# Patient Record
Sex: Male | Born: 1955 | Race: Black or African American | Hispanic: No | Smoking: Current some day smoker
Health system: Southern US, Community
[De-identification: ages and names within clinical notes are randomized; demographics above are authoritative.]

## PROBLEM LIST (undated history)

## (undated) DIAGNOSIS — H548 Legal blindness, as defined in USA: Secondary | ICD-10-CM

## (undated) DIAGNOSIS — E785 Hyperlipidemia, unspecified: Secondary | ICD-10-CM

## (undated) DIAGNOSIS — C61 Malignant neoplasm of prostate: Secondary | ICD-10-CM

## (undated) DIAGNOSIS — F419 Anxiety disorder, unspecified: Secondary | ICD-10-CM

## (undated) DIAGNOSIS — I2510 Atherosclerotic heart disease of native coronary artery without angina pectoris: Secondary | ICD-10-CM

## (undated) DIAGNOSIS — I214 Non-ST elevation (NSTEMI) myocardial infarction: Secondary | ICD-10-CM

## (undated) DIAGNOSIS — I213 ST elevation (STEMI) myocardial infarction of unspecified site: Secondary | ICD-10-CM

## (undated) DIAGNOSIS — F329 Major depressive disorder, single episode, unspecified: Secondary | ICD-10-CM

## (undated) DIAGNOSIS — F32A Depression, unspecified: Secondary | ICD-10-CM

## (undated) DIAGNOSIS — I10 Essential (primary) hypertension: Secondary | ICD-10-CM

## (undated) DIAGNOSIS — H409 Unspecified glaucoma: Secondary | ICD-10-CM

## (undated) HISTORY — DX: Hyperlipidemia, unspecified: E78.5

## (undated) HISTORY — PX: CARDIOVASCULAR STRESS TEST: SHX262

## (undated) HISTORY — PX: TRANSTHORACIC ECHOCARDIOGRAM: SHX275

## (undated) HISTORY — PX: EYE SURGERY: SHX253

## (undated) HISTORY — PX: CORONARY ANGIOPLASTY WITH STENT PLACEMENT: SHX49

---

## 2010-01-15 ENCOUNTER — Emergency Department (HOSPITAL_COMMUNITY): Admission: EM | Admit: 2010-01-15 | Discharge: 2010-01-15 | Payer: Self-pay | Admitting: Emergency Medicine

## 2010-04-06 ENCOUNTER — Emergency Department (HOSPITAL_COMMUNITY): Admission: EM | Admit: 2010-04-06 | Discharge: 2010-04-06 | Payer: Self-pay | Admitting: Emergency Medicine

## 2010-08-07 ENCOUNTER — Emergency Department (HOSPITAL_COMMUNITY)
Admission: EM | Admit: 2010-08-07 | Discharge: 2010-08-07 | Payer: Self-pay | Source: Home / Self Care | Admitting: Emergency Medicine

## 2010-08-20 ENCOUNTER — Emergency Department (HOSPITAL_COMMUNITY)
Admission: EM | Admit: 2010-08-20 | Discharge: 2010-08-20 | Payer: Self-pay | Source: Home / Self Care | Admitting: Emergency Medicine

## 2010-09-04 ENCOUNTER — Emergency Department (HOSPITAL_COMMUNITY)
Admission: EM | Admit: 2010-09-04 | Discharge: 2010-09-04 | Discharge status: Against Medical Advice | Payer: Medicare Other | Attending: Emergency Medicine | Admitting: Emergency Medicine

## 2010-09-04 DIAGNOSIS — Z0389 Encounter for observation for other suspected diseases and conditions ruled out: Secondary | ICD-10-CM | POA: Insufficient documentation

## 2010-09-14 ENCOUNTER — Emergency Department (HOSPITAL_COMMUNITY)
Admission: EM | Admit: 2010-09-14 | Discharge: 2010-09-14 | Disposition: A | Discharge status: Home / Self Care | Payer: Medicare Other | Attending: Emergency Medicine | Admitting: Emergency Medicine

## 2010-09-14 DIAGNOSIS — H409 Unspecified glaucoma: Secondary | ICD-10-CM | POA: Insufficient documentation

## 2010-09-14 DIAGNOSIS — Z9889 Other specified postprocedural states: Secondary | ICD-10-CM | POA: Insufficient documentation

## 2010-09-14 DIAGNOSIS — H5710 Ocular pain, unspecified eye: Secondary | ICD-10-CM | POA: Insufficient documentation

## 2010-09-14 DIAGNOSIS — Z760 Encounter for issue of repeat prescription: Secondary | ICD-10-CM | POA: Insufficient documentation

## 2010-09-14 DIAGNOSIS — E7800 Pure hypercholesterolemia, unspecified: Secondary | ICD-10-CM | POA: Insufficient documentation

## 2010-09-14 DIAGNOSIS — F411 Generalized anxiety disorder: Secondary | ICD-10-CM | POA: Insufficient documentation

## 2010-12-22 ENCOUNTER — Emergency Department (HOSPITAL_COMMUNITY)
Admission: EM | Admit: 2010-12-22 | Discharge: 2010-12-23 | Disposition: A | Discharge status: Home / Self Care | Payer: Medicare Other | Attending: Emergency Medicine | Admitting: Emergency Medicine

## 2010-12-22 DIAGNOSIS — F411 Generalized anxiety disorder: Secondary | ICD-10-CM | POA: Insufficient documentation

## 2010-12-22 DIAGNOSIS — H409 Unspecified glaucoma: Secondary | ICD-10-CM | POA: Insufficient documentation

## 2010-12-22 DIAGNOSIS — E7800 Pure hypercholesterolemia, unspecified: Secondary | ICD-10-CM | POA: Insufficient documentation

## 2010-12-22 DIAGNOSIS — R1013 Epigastric pain: Secondary | ICD-10-CM | POA: Insufficient documentation

## 2010-12-22 DIAGNOSIS — H543 Unqualified visual loss, both eyes: Secondary | ICD-10-CM | POA: Insufficient documentation

## 2010-12-23 LAB — DIFFERENTIAL
Basophils Absolute: 0.1 10*3/uL (ref 0.0–0.1)
Lymphocytes Relative: 31 % (ref 12–46)
Monocytes Absolute: 0.9 10*3/uL (ref 0.1–1.0)
Neutro Abs: 9 10*3/uL — ABNORMAL HIGH (ref 1.7–7.7)

## 2010-12-23 LAB — CBC
HCT: 43.3 % (ref 39.0–52.0)
Hemoglobin: 15.3 g/dL (ref 13.0–17.0)
WBC: 14.9 10*3/uL — ABNORMAL HIGH (ref 4.0–10.5)

## 2010-12-23 LAB — COMPREHENSIVE METABOLIC PANEL
ALT: 31 U/L (ref 0–53)
Alkaline Phosphatase: 105 U/L (ref 39–117)
CO2: 23 mEq/L (ref 19–32)
GFR calc non Af Amer: >60 mL/min (ref >60)
Glucose, Bld: 106 mg/dL — ABNORMAL HIGH (ref 70–99)
Potassium: 3.9 mEq/L (ref 3.5–5.1)
Sodium: 138 mEq/L (ref 135–145)

## 2010-12-23 LAB — LIPASE, BLOOD: Lipase: 30 U/L (ref 11–59)

## 2017-03-07 ENCOUNTER — Encounter (HOSPITAL_COMMUNITY): Payer: Self-pay

## 2017-03-07 ENCOUNTER — Emergency Department (HOSPITAL_COMMUNITY): Payer: Medicare Other

## 2017-03-07 ENCOUNTER — Emergency Department (HOSPITAL_COMMUNITY)
Admission: EM | Admit: 2017-03-07 | Discharge: 2017-03-07 | Disposition: A | Discharge status: Home / Self Care | Payer: Medicare Other | Attending: Emergency Medicine | Admitting: Emergency Medicine

## 2017-03-07 DIAGNOSIS — Z7982 Long term (current) use of aspirin: Secondary | ICD-10-CM | POA: Diagnosis not present

## 2017-03-07 DIAGNOSIS — Z79899 Other long term (current) drug therapy: Secondary | ICD-10-CM | POA: Insufficient documentation

## 2017-03-07 DIAGNOSIS — I252 Old myocardial infarction: Secondary | ICD-10-CM | POA: Diagnosis not present

## 2017-03-07 DIAGNOSIS — I2510 Atherosclerotic heart disease of native coronary artery without angina pectoris: Secondary | ICD-10-CM | POA: Insufficient documentation

## 2017-03-07 DIAGNOSIS — R0789 Other chest pain: Secondary | ICD-10-CM

## 2017-03-07 DIAGNOSIS — F17210 Nicotine dependence, cigarettes, uncomplicated: Secondary | ICD-10-CM | POA: Diagnosis not present

## 2017-03-07 HISTORY — DX: Atherosclerotic heart disease of native coronary artery without angina pectoris: I25.10

## 2017-03-07 LAB — COMPREHENSIVE METABOLIC PANEL
ALBUMIN: 3.8 g/dL (ref 3.5–5.0)
ALT: 29 U/L (ref 17–63)
AST: 21 U/L (ref 15–41)
Alkaline Phosphatase: 105 U/L (ref 38–126)
Anion gap: 9 (ref 5–15)
BUN: 14 mg/dL (ref 6–20)
CHLORIDE: 107 mmol/L (ref 101–111)
CO2: 22 mmol/L (ref 22–32)
CREATININE: 0.92 mg/dL (ref 0.61–1.24)
Calcium: 9.1 mg/dL (ref 8.9–10.3)
GFR calc Af Amer: >60 mL/min (ref >60)
GLUCOSE: 99 mg/dL (ref 65–99)
Potassium: 3.8 mmol/L (ref 3.5–5.1)
Sodium: 138 mmol/L (ref 135–145)
Total Bilirubin: 0.2 mg/dL — ABNORMAL LOW (ref 0.3–1.2)
Total Protein: 7.4 g/dL (ref 6.5–8.1)

## 2017-03-07 LAB — CBC WITH DIFFERENTIAL/PLATELET
Basophils Absolute: 0.1 10*3/uL (ref 0.0–0.1)
Basophils Relative: 0 %
Eosinophils Absolute: 0.3 10*3/uL (ref 0.0–0.7)
Eosinophils Relative: 3 %
HEMATOCRIT: 42.6 % (ref 39.0–52.0)
HEMOGLOBIN: 14.8 g/dL (ref 13.0–17.0)
LYMPHS ABS: 3.8 10*3/uL (ref 0.7–4.0)
LYMPHS PCT: 31 %
MCH: 30.4 pg (ref 26.0–34.0)
MCHC: 34.7 g/dL (ref 30.0–36.0)
MCV: 87.5 fL (ref 78.0–100.0)
Monocytes Absolute: 0.6 10*3/uL (ref 0.1–1.0)
Monocytes Relative: 5 %
NEUTROS ABS: 7.3 10*3/uL (ref 1.7–7.7)
NEUTROS PCT: 61 %
PLATELETS: 226 10*3/uL (ref 150–400)
RBC: 4.87 MIL/uL (ref 4.22–5.81)
RDW: 13.5 % (ref 11.5–15.5)
WBC: 12 10*3/uL — ABNORMAL HIGH (ref 4.0–10.5)

## 2017-03-07 LAB — PROTIME-INR
INR: 1.02
PROTHROMBIN TIME: 13.4 s (ref 11.4–15.2)

## 2017-03-07 LAB — BRAIN NATRIURETIC PEPTIDE: B Natriuretic Peptide: 10.8 pg/mL (ref 0.0–100.0)

## 2017-03-07 LAB — TROPONIN I: Troponin I: <0.03 ng/mL (ref <0.03)

## 2017-03-07 LAB — MAGNESIUM: MAGNESIUM: 2 mg/dL (ref 1.7–2.4)

## 2017-03-07 MED ORDER — ASPIRIN 81 MG PO CHEW
324.0000 mg | CHEWABLE_TABLET | Freq: Once | ORAL | Status: AC
  Administered 2017-03-07: 324 mg via ORAL
  Filled 2017-03-07: qty 4

## 2017-03-07 NOTE — ED Provider Notes (Signed)
[facility] DEPT Provider Note   CSN: [id number] Arrival date & time: 03/07/17  1528     History   Chief Complaint Chief Complaint  Patient presents with  . Chest Pain    HPI Stephen Stone is a 61 y.o. male.  HPI  Patient presents for an episode of chest pain which has resolved. Patient has history of CAD, with multiple prior MI. He notes today, a few hours ago he felt pressure in his upper chest. Pain was mild. Patient had complete relief after belching a few times. Given his history he was brought here by his daughter for evaluation. He notes that he has been generally otherwise well, taking medication as directed. He recently moved to this area, and is scheduled to see primary care next week.   Past Medical History:  Diagnosis Date  . Blind   . Coronary artery disease   . History of coronary artery stent placement     There are no active problems to display for this patient.   History reviewed. No pertinent surgical history.     Home Medications    Prior to Admission medications   Medication Sig Start Date End Date Taking? Authorizing Provider  aspirin 325 MG EC tablet Take 325 mg by mouth daily.   Yes [provider]  atorvastatin (LIPITOR) 80 MG tablet Take 80 mg by mouth daily.   Yes [provider]  escitalopram (LEXAPRO) 20 MG tablet Take 20 mg by mouth daily.   Yes [provider]  lisinopril (PRINIVIL,ZESTRIL) 20 MG tablet Take 20 mg by mouth daily.   Yes [provider]  metoprolol succinate (TOPROL-XL) 100 MG 24 hr tablet Take 100 mg by mouth daily. Take with or immediately following a meal.   Yes [provider]  Misc Natural Products (PROSTATE HEALTH PO) Take 1 tablet by mouth daily.   Yes [provider]  QUEtiapine (SEROQUEL) 200 MG tablet Take 200 mg by mouth at bedtime.   Yes [provider]    Family History History reviewed. No pertinent family history.  Social  History Social History  Substance Use Topics  . Smoking status: Current Every Day Smoker  . Smokeless tobacco: Never Used  . Alcohol use No     Allergies   Patient has no known allergies.   Review of Systems Review of Systems  Constitutional:       Per HPI, otherwise negative  HENT:       Per HPI, otherwise negative  Eyes:       Blind  Respiratory:       Per HPI, otherwise negative  Cardiovascular:       Per HPI, otherwise negative  Gastrointestinal: Negative for vomiting.  Endocrine:       Negative aside from HPI  Genitourinary:       Neg aside from HPI   Musculoskeletal:       Per HPI, otherwise negative  Skin: Negative.   Neurological: Negative for syncope.     Physical Exam Updated Vital Signs There were no vitals taken for this visit.  Physical Exam  Constitutional: He is oriented to person, place, and time. He appears well-developed. No distress.  HENT:  Head: Normocephalic and atraumatic.  Eyes:  Blind  Cardiovascular: Normal rate and regular rhythm.   Pulmonary/Chest: Effort normal. No stridor. No respiratory distress.  Abdominal: He exhibits no distension.  Musculoskeletal: He exhibits no edema.  Neurological: He is alert and oriented to person, place, and time.  Skin: Skin is warm and dry.  Psychiatric: He has a normal mood and affect.  Nursing note and vitals reviewed.    ED Treatments / Results  Labs (all labs ordered are listed, but only abnormal results are displayed) Labs Reviewed  COMPREHENSIVE METABOLIC PANEL  MAGNESIUM  TROPONIN I  BRAIN NATRIURETIC PEPTIDE  CBC WITH DIFFERENTIAL/PLATELET  PROTIME-INR  CBG MONITORING, ED    EKG  EKG Interpretation  Date/Time:  Friday March 07 2017 15:34:40 EDT Ventricular Rate:  90 PR Interval:    QRS Duration: 89 QT Interval:  351 QTC Calculation: 430 R Axis:   34 Text Interpretation:  Sinus rhythm Minimal ST elevation, inferior leads Borderline ECG Confirmed by Carmin Muskrat  [phone]) on 03/07/2017 4:23:21 PM Also confirmed by Carmin Muskrat ([id number]), editor Laurena Spies [phone])  on 03/07/2017 4:29:33 PM       Radiology Dg Chest Portable 1 View  Result Date: 03/07/2017 CLINICAL DATA:  Chest pain EXAM: PORTABLE CHEST 1 VIEW COMPARISON:  None. FINDINGS: There is slight scarring in the left base. The lungs elsewhere are clear. The heart size and pulmonary vascularity are normal. No adenopathy. No pneumothorax. No bone lesions. IMPRESSION: Mobile scarring left base. No edema or consolidation. Cardiac silhouette within normal limits. Electronically Signed   By: Lowella Grip III M.D.   On: 03/07/2017 16:59    Procedures Procedures (including critical care time)  Medications Ordered in ED Medications  aspirin chewable tablet 324 mg (not administered)     Initial Impression / Assessment and Plan / ED Course  I have reviewed the triage vital signs and the nursing notes.  Pertinent labs & imaging results that were available during my care of the patient were reviewed by me and considered in my medical decision making (see chart for details).  On repeat exam the patient is in no distress, awake, alert, still no ongoing pain. With nonischemic EKG, unremarkable x-ray, reassuring labs, low suspicion for atypical ACS. No evidence for other acute new pathology, including pneumonia, pneumothorax, PE. Patient is appropriately medically managed for his CAD. Patient will follow up as scheduled with primary care next week.   Final Clinical Impressions(s) / ED Diagnoses  Atypical chest pain   Carmin Muskrat, MD 03/07/17 1801

## 2017-03-07 NOTE — ED Notes (Signed)
Bed: WTR5 Expected date:  Expected time:  Means of arrival:  Comments: 

## 2017-03-07 NOTE — ED Triage Notes (Signed)
Per pt, chest pain starting x 2 hours ago. Hx of MI with stents.  No n/v.  No shortness of breath

## 2017-03-07 NOTE — Discharge Instructions (Signed)
As discussed, your evaluation today has been largely reassuring.  But, it is important that you monitor your condition carefully, and do not hesitate to return to the ED if you develop new, or concerning changes in your condition. ? ?Otherwise, please follow-up with your physician for appropriate ongoing care. ? ?

## 2017-03-13 ENCOUNTER — Ambulatory Visit (INDEPENDENT_AMBULATORY_CARE_PROVIDER_SITE_OTHER): Payer: Medicare Other | Admitting: Physician Assistant

## 2017-03-13 ENCOUNTER — Encounter (INDEPENDENT_AMBULATORY_CARE_PROVIDER_SITE_OTHER): Payer: Self-pay | Admitting: Physician Assistant

## 2017-03-13 VITALS — BP 158/96 | HR 88 | Temp 98.0°F | Wt 225.6 lb

## 2017-03-13 DIAGNOSIS — H409 Unspecified glaucoma: Secondary | ICD-10-CM | POA: Diagnosis not present

## 2017-03-13 DIAGNOSIS — I25709 Atherosclerosis of coronary artery bypass graft(s), unspecified, with unspecified angina pectoris: Secondary | ICD-10-CM | POA: Diagnosis not present

## 2017-03-13 DIAGNOSIS — C61 Malignant neoplasm of prostate: Secondary | ICD-10-CM | POA: Diagnosis not present

## 2017-03-13 DIAGNOSIS — I209 Angina pectoris, unspecified: Secondary | ICD-10-CM | POA: Diagnosis not present

## 2017-03-13 DIAGNOSIS — Z760 Encounter for issue of repeat prescription: Secondary | ICD-10-CM | POA: Diagnosis not present

## 2017-03-13 MED ORDER — ESCITALOPRAM OXALATE 20 MG PO TABS: 20.0000 mg | ORAL_TABLET | Freq: Every day | ORAL | 3 refills | Status: DC

## 2017-03-13 MED ORDER — QUETIAPINE FUMARATE 200 MG PO TABS: 200.0000 mg | ORAL_TABLET | Freq: Every day | ORAL | 3 refills | Status: DC

## 2017-03-13 MED ORDER — ATORVASTATIN CALCIUM 80 MG PO TABS: 80.0000 mg | ORAL_TABLET | Freq: Every evening | ORAL | 3 refills | Status: DC

## 2017-03-13 MED ORDER — LISINOPRIL 20 MG PO TABS: 20.0000 mg | ORAL_TABLET | Freq: Every day | ORAL | 3 refills | Status: DC

## 2017-03-13 MED ORDER — ASPIRIN 325 MG PO TBEC: 325.0000 mg | DELAYED_RELEASE_TABLET | Freq: Every day | ORAL | 30 refills | Status: DC

## 2017-03-13 MED ORDER — FOLIC ACID 400 MCG PO TABS: 400.0000 ug | ORAL_TABLET | Freq: Every day | ORAL | 3 refills | Status: DC

## 2017-03-13 MED ORDER — METOPROLOL SUCCINATE ER 100 MG PO TB24: 100.0000 mg | ORAL_TABLET | Freq: Every day | ORAL | 3 refills | Status: DC

## 2017-03-13 NOTE — Progress Notes (Signed)
Subjective:  Patient ID: Stephen Stone, male    DOB: 1955/10/09  Age: 61 y.o. MRN: [id number]  CC: new patient needing referrals   HPI Stephen Stone is a 61 y.o. male with a PMH of blindness, CAD, PCI s/p stent 09/2102, and stage 6 prostate cancer. Is coming from [place] since 2 months ago. Was "spoiled" in [place] and is looking for experienced doctors to maintain his health. Has two brothers that have died from prostate cancer.     Asks for an ophthalmologist that specializes in ophthalmological diseases. 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     Patient request referral to cardiology so he may f/u with his CAD s/p cobalt stent. Says he only wants a doctor that "knows what he is talking about".    Requests urology referral for implantation of seeds for his prostate cancer. Says he is prepared to give all medical records to each respective specialist. All he wants is the referral to the specialists. Had no pills today and requests refill of all his medications. Does not endorse any complaint or symptom.   Outpatient Medications Prior to Visit  Medication Sig Dispense Refill  . aspirin 325 MG EC tablet Take 325 mg by mouth daily.    Marland Kitchen atorvastatin (LIPITOR) 80 MG tablet Take 80 mg by mouth every evening.     Marland Kitchen lisinopril (PRINIVIL,ZESTRIL) 20 MG tablet Take 20 mg by mouth daily.    . metoprolol succinate (TOPROL-XL) 100 MG 24 hr tablet Take 100 mg by mouth daily. Take with or immediately following a meal.    . QUEtiapine (SEROQUEL) 200 MG tablet Take 200 mg by mouth at bedtime.    Marland Kitchen escitalopram (LEXAPRO) 20 MG tablet Take 20 mg by mouth daily.    . Misc Natural Products (PROSTATE HEALTH PO) Take 1 tablet by mouth daily.     No facility-administered medications prior to visit.      ROS Review of Systems  Constitutional: Negative for chills, fever and malaise/fatigue.  Eyes: Negative for blurred vision.   Respiratory: Negative for shortness of breath.   Cardiovascular: Negative for chest pain and palpitations.  Gastrointestinal: Negative for abdominal pain and nausea.  Genitourinary: Negative for dysuria and hematuria.  Musculoskeletal: Negative for joint pain and myalgias.  Skin: Negative for rash.  Neurological: Negative for tingling and headaches.  Psychiatric/Behavioral: Negative for depression. The patient is not nervous/anxious.     Objective:  BP (!) 158/96 (BP Location: Left Arm, Patient Position: Sitting, Cuff Size: Normal)   Pulse 88   Temp 98 F (36.7 C) (Oral)   Wt 225 lb 9.6 oz (102.3 kg)   SpO2 97%   BMI 30.60 kg/m   BP/Weight 03/13/2017 11/14/6220  Systolic BP 979 892  Diastolic BP 96 80  Wt. (Lbs) 225.6 220  BMI 30.6 29.84      Physical Exam  Constitutional: He is oriented to person, place, and time.  Well developed, well nourished, NAD, polite, uses walking stick.  HENT:  Head: Normocephalic and atraumatic.  Eyes: No scleral icterus.  Opacified right eye.  Neck: Normal range of motion. Neck supple. No thyromegaly present.  Cardiovascular: Normal rate, regular rhythm and normal heart sounds.   No carotid bruit  Pulmonary/Chest: Effort normal and breath sounds normal.  Musculoskeletal: He exhibits no edema.  Neurological: He is alert and oriented to person, place, and time. No cranial nerve deficit. Coordination normal.  Skin: Skin is warm and dry. No rash noted. No erythema. No pallor.  Psychiatric: He has a normal mood and affect. His behavior is normal. Thought content normal.  Vitals reviewed.    Assessment & Plan:    1. Glaucoma of both eyes, unspecified glaucoma type - Ambulatory referral to Ophthalmology  2. Coronary artery disease involving coronary bypass graft with angina pectoris, unspecified whether native or transplanted heart [facility]) - Ambulatory referral to Cardiology  3. Prostate cancer [facility]) - Ambulatory referral to Urology  4.  Medication refill - QUEtiapine (SEROQUEL) 200 MG tablet; Take 1 tablet (200 mg total) by mouth at bedtime.  Dispense: 90 tablet; Refill: 3 - metoprolol succinate (TOPROL-XL) 100 MG 24 hr tablet; Take 1 tablet (100 mg total) by mouth daily. Take with or immediately following a meal.  Dispense: 90 tablet; Refill: 3 - lisinopril (PRINIVIL,ZESTRIL) 20 MG tablet; Take 1 tablet (20 mg total) by mouth daily.  Dispense: 90 tablet; Refill: 3 - folic acid (FOLVITE) 628 MCG tablet; Take 1 tablet (400 mcg total) by mouth daily.  Dispense: 90 tablet; Refill: 3 - escitalopram (LEXAPRO) 20 MG tablet; Take 1 tablet (20 mg total) by mouth daily.  Dispense: 90 tablet; Refill: 3 - atorvastatin (LIPITOR) 80 MG tablet; Take 1 tablet (80 mg total) by mouth every evening.  Dispense: 90 tablet; Refill: 3 - aspirin 325 MG EC tablet; Take 1 tablet (325 mg total) by mouth daily.  Dispense: 90 tablet; Refill: 30   Meds ordered this encounter  Medications  . QUEtiapine (SEROQUEL) 200 MG tablet    Sig: Take 1 tablet (200 mg total) by mouth at bedtime.    Dispense:  90 tablet    Refill:  3    Order Specific Question:   Supervising Provider    Answer:   Tresa Garter [id number]  . metoprolol succinate (TOPROL-XL) 100 MG 24 hr tablet    Sig: Take 1 tablet (100 mg total) by mouth daily. Take with or immediately following a meal.    Dispense:  90 tablet    Refill:  3    Order Specific Question:   Supervising Provider    Answer:   Tresa Garter [id number]  . lisinopril (PRINIVIL,ZESTRIL) 20 MG tablet    Sig: Take 1 tablet (20 mg total) by mouth daily.    Dispense:  90 tablet    Refill:  3    Order Specific Question:   Supervising Provider    Answer:   Tresa Garter [id number]  . folic acid (FOLVITE) 366 MCG tablet    Sig: Take 1 tablet (400 mcg total) by mouth daily.    Dispense:  90 tablet    Refill:  3    Order Specific Question:   Supervising Provider    Answer:   Tresa Garter [id number]   . escitalopram (LEXAPRO) 20 MG tablet    Sig: Take 1 tablet (20 mg total) by mouth daily.    Dispense:  90 tablet    Refill:  3    Order Specific Question:   Supervising Provider    Answer:   Tresa Garter [id number]  . atorvastatin (LIPITOR) 80 MG tablet    Sig: Take 1 tablet (80 mg total) by mouth every evening.    Dispense:  90 tablet    Refill:  3    Order Specific Question:   Supervising Provider    Answer:   Tresa Garter [id number]  . aspirin 325 MG EC tablet  Sig: Take 1 tablet (325 mg total) by mouth daily.    Dispense:  90 tablet    Refill:  30    Order Specific Question:   Supervising Provider    Answer:   Tresa Garter [[id number]]    Follow-up: Return in about 6 weeks (around 04/24/2017).   [name] PA

## 2017-03-13 NOTE — Progress Notes (Signed)
Pt has stage 6 low grade prostate cancer Pt has had 4 heart attacks  Pt has cobalt stent placed

## 2017-03-19 ENCOUNTER — Other Ambulatory Visit (INDEPENDENT_AMBULATORY_CARE_PROVIDER_SITE_OTHER): Payer: Self-pay | Admitting: Physician Assistant

## 2017-03-19 ENCOUNTER — Other Ambulatory Visit (INDEPENDENT_AMBULATORY_CARE_PROVIDER_SITE_OTHER): Payer: Medicare Other

## 2017-03-19 DIAGNOSIS — I25810 Atherosclerosis of coronary artery bypass graft(s) without angina pectoris: Secondary | ICD-10-CM

## 2017-03-19 DIAGNOSIS — C61 Malignant neoplasm of prostate: Secondary | ICD-10-CM

## 2017-03-19 NOTE — Progress Notes (Signed)
Pt asking for labs to be performed based on last visit's complaints.

## 2017-03-20 LAB — CBC WITH DIFFERENTIAL/PLATELET
Basophils Absolute: 0 10*3/uL (ref 0.0–0.2)
Basos: 0 %
EOS (ABSOLUTE): 0.3 10*3/uL (ref 0.0–0.4)
EOS: 2 %
HEMATOCRIT: 41 % (ref 37.5–51.0)
HEMOGLOBIN: 13.6 g/dL (ref 13.0–17.7)
Immature Grans (Abs): 0 10*3/uL (ref 0.0–0.1)
Immature Granulocytes: 0 %
LYMPHS ABS: 2.7 10*3/uL (ref 0.7–3.1)
Lymphs: 19 %
MCH: 30 pg (ref 26.6–33.0)
MCHC: 33.2 g/dL (ref 31.5–35.7)
MCV: 91 fL (ref 79–97)
MONOCYTES: 4 %
Monocytes Absolute: 0.6 10*3/uL (ref 0.1–0.9)
NEUTROS ABS: 10.1 10*3/uL — AB (ref 1.4–7.0)
Neutrophils: 75 %
Platelets: 223 10*3/uL (ref 150–379)
RBC: 4.53 x10E6/uL (ref 4.14–5.80)
RDW: 14.4 % (ref 12.3–15.4)
WBC: 13.7 10*3/uL — ABNORMAL HIGH (ref 3.4–10.8)

## 2017-03-20 LAB — COMPREHENSIVE METABOLIC PANEL
ALBUMIN: 3.8 g/dL (ref 3.6–4.8)
ALT: 24 IU/L (ref 0–44)
AST: 16 IU/L (ref 0–40)
Albumin/Globulin Ratio: 1.4 (ref 1.2–2.2)
Alkaline Phosphatase: 117 IU/L (ref 39–117)
BILIRUBIN TOTAL: 0.2 mg/dL (ref 0.0–1.2)
BUN / CREAT RATIO: 13 (ref 10–24)
BUN: 14 mg/dL (ref 8–27)
CO2: 20 mmol/L (ref 20–29)
CREATININE: 1.08 mg/dL (ref 0.76–1.27)
Calcium: 8.7 mg/dL (ref 8.6–10.2)
Chloride: 107 mmol/L — ABNORMAL HIGH (ref 96–106)
GFR calc non Af Amer: 73 mL/min/{1.73_m2} (ref >59)
GFR, EST AFRICAN AMERICAN: 84 mL/min/{1.73_m2} (ref >59)
GLUCOSE: 102 mg/dL — AB (ref 65–99)
Globulin, Total: 2.8 g/dL (ref 1.5–4.5)
Potassium: 4.2 mmol/L (ref 3.5–5.2)
Sodium: 142 mmol/L (ref 134–144)
TOTAL PROTEIN: 6.6 g/dL (ref 6.0–8.5)

## 2017-03-20 LAB — LIPID PANEL
CHOL/HDL RATIO: 3.9 ratio (ref 0.0–5.0)
Cholesterol, Total: 125 mg/dL (ref 100–199)
HDL: 32 mg/dL — AB (ref >39)
LDL CALC: 69 mg/dL (ref 0–99)
TRIGLYCERIDES: 122 mg/dL (ref 0–149)
VLDL Cholesterol Cal: 24 mg/dL (ref 5–40)

## 2017-03-20 LAB — PSA: Prostate Specific Ag, Serum: 18.4 ng/mL — ABNORMAL HIGH (ref 0.0–4.0)

## 2017-03-25 ENCOUNTER — Encounter (INDEPENDENT_AMBULATORY_CARE_PROVIDER_SITE_OTHER): Payer: Self-pay | Admitting: General Practice

## 2017-04-08 NOTE — Progress Notes (Signed)
Referring-Roger Roderic Ovens PA-C Reason for referral-CAD  HPI: 61 yo male for evaluation of CAD at request of [name], PA-C. Patient seen in the emergency room August 2018 with chest pain. Chest x-ray showed scarring left base but no edema or consolidation. Troponins normal. BNP 10.8. Liver functions normal. Hemoglobin 14.8. Patient previously resided in [place]. He apparently had a myocardial infarction 4-5 years ago in [place] and had a stent placed. No records available. He had follow-up stress tests and echocardiogram in [place]. He moved to this area in April to be close to his daughter. He was seen in emergency room with substernal chest pressure that he feels was likely indigestion. No radiation or associated symptoms and resolved with GI cocktail. He otherwise denies dyspnea, exertional chest pain, palpitations or syncope. Because of the above we were asked to evaluate.   Current Outpatient Prescriptions  Medication Sig Dispense Refill  . aspirin 325 MG EC tablet Take 1 tablet (325 mg total) by mouth daily. 90 tablet 30  . atorvastatin (LIPITOR) 80 MG tablet Take 1 tablet (80 mg total) by mouth every evening. 90 tablet 3  . escitalopram (LEXAPRO) 20 MG tablet Take 1 tablet (20 mg total) by mouth daily. 90 tablet 3  . folic acid (FOLVITE) 161 MCG tablet Take 1 tablet (400 mcg total) by mouth daily. 90 tablet 3  . lisinopril (PRINIVIL,ZESTRIL) 20 MG tablet Take 1 tablet (20 mg total) by mouth daily. 90 tablet 3  . metoprolol succinate (TOPROL-XL) 100 MG 24 hr tablet Take 1 tablet (100 mg total) by mouth daily. Take with or immediately following a meal. 90 tablet 3  . Misc Natural Products (PROSTATE HEALTH PO) Take 1 tablet by mouth daily.    . QUEtiapine (SEROQUEL) 200 MG tablet Take 1 tablet (200 mg total) by mouth at bedtime. 90 tablet 3   No current facility-administered medications for this visit.     No Known Allergies   Past Medical History:  Diagnosis Date    . Blind   . Coronary artery disease   . History of coronary artery stent placement   . Hyperlipidemia     Past Surgical History:  Procedure Laterality Date  . EYE SURGERY      Social History   Social History  . Marital status: Divorced    Spouse name: N/A  . Number of children: 7  . Years of education: N/A   Occupational History  . Not on file.   Social History Main Topics  . Smoking status: Former Research scientist (life sciences)  . Smokeless tobacco: Never Used  . Alcohol use No  . Drug use: Yes    Types: Marijuana  . Sexual activity: Not on file   Other Topics Concern  . Not on file   Social History Narrative  . No narrative on file    Family History  Problem Relation Age of Onset  . Cancer Mother   . CAD Father     ROS: no fevers or chills, productive cough, hemoptysis, dysphasia, odynophagia, melena, hematochezia, dysuria, hematuria, rash, seizure activity, orthopnea, PND, pedal edema, claudication. Remaining systems are negative.  Physical Exam:   Blood pressure 110/78, pulse 72, height 6' (1.829 m), weight 112.5 kg (248 lb).  General:  Well developed/well nourished in NAD Skin warm/dry Patient not depressed No peripheral clubbing Back-normal HEENT-patient is blind with abnormality in right eye  Neck supple/normal carotid upstroke bilaterally; no bruits; no JVD; no thyromegaly chest - CTA/ normal expansion CV -  RRR/normal S1 and S2; no murmurs, rubs or gallops;  PMI nondisplaced Abdomen -NT/ND, no HSM, no mass, + bowel sounds, no bruit 2+ femoral pulses, no bruits Ext-no edema, chords, 2+ DP Neuro-grossly nonfocal  ECG - 03/07/2017-sinus rhythm with no ST changes. personally reviewed  A/P  1 Coronary artery disease-we will obtain all previous records from [place]. Continue aspirin and statin.    2 Chest pain-recent evaluation in the emergency room. Troponins normal. Likely GI in etiology. Arrange stress nuclear study for risk stratification.   3  Hyperlipidemia-continue statin.  4 hypertension-blood pressure is controlled. Continue present medications.  5 tobacco abuse-patient counseled on discontinuing.  [name], MD

## 2017-04-14 ENCOUNTER — Ambulatory Visit (INDEPENDENT_AMBULATORY_CARE_PROVIDER_SITE_OTHER): Payer: Medicare Other | Admitting: Cardiology

## 2017-04-14 ENCOUNTER — Encounter (INDEPENDENT_AMBULATORY_CARE_PROVIDER_SITE_OTHER): Payer: Self-pay

## 2017-04-14 ENCOUNTER — Encounter: Payer: Self-pay | Admitting: Cardiology

## 2017-04-14 VITALS — BP 110/78 | HR 72 | Ht 72.0 in | Wt 248.0 lb

## 2017-04-14 DIAGNOSIS — I10 Essential (primary) hypertension: Secondary | ICD-10-CM | POA: Diagnosis not present

## 2017-04-14 DIAGNOSIS — I2510 Atherosclerotic heart disease of native coronary artery without angina pectoris: Secondary | ICD-10-CM

## 2017-04-14 DIAGNOSIS — E7800 Pure hypercholesterolemia, unspecified: Secondary | ICD-10-CM | POA: Diagnosis not present

## 2017-04-14 NOTE — Patient Instructions (Signed)
Medication Instructions:   NO CHANGE  Testing/Procedures:  Your physician has requested that you have en exercise stress myoview. For further information please visit [email or web address]. Please follow instruction sheet, as given.TAKE ALL MEDICATIONS    Follow-Up:  Your physician wants you to follow-up in: [place] will receive a reminder letter in the mail two months in advance. If you don't receive a letter, please call our office to schedule the follow-up appointment.   If you need a refill on your cardiac medications before your next appointment, please call your pharmacy.

## 2017-04-18 ENCOUNTER — Telehealth (HOSPITAL_COMMUNITY): Payer: Self-pay

## 2017-04-18 NOTE — Telephone Encounter (Signed)
Encounter complete. 

## 2017-04-23 ENCOUNTER — Ambulatory Visit (HOSPITAL_COMMUNITY)
Admission: RE | Admit: 2017-04-23 | Discharge: 2017-04-23 | Disposition: A | Discharge status: Home / Self Care | Payer: Medicare Other | Source: Ambulatory Visit | Attending: Cardiology | Admitting: Cardiology

## 2017-04-23 ENCOUNTER — Ambulatory Visit (INDEPENDENT_AMBULATORY_CARE_PROVIDER_SITE_OTHER): Payer: Medicare Other | Admitting: Physician Assistant

## 2017-04-23 DIAGNOSIS — I2510 Atherosclerotic heart disease of native coronary artery without angina pectoris: Secondary | ICD-10-CM | POA: Insufficient documentation

## 2017-04-23 LAB — MYOCARDIAL PERFUSION IMAGING
CHL CUP NUCLEAR SDS: 2
CHL CUP NUCLEAR SSS: 6
CHL CUP RESTING HR STRESS: 73 {beats}/min
LV dias vol: 86 mL (ref 62–150)
LV sys vol: 36 mL
NUC STRESS TID: 1.26
Peak HR: 107 {beats}/min
SRS: 4

## 2017-04-23 MED ORDER — TECHNETIUM TC 99M TETROFOSMIN IV KIT
30.2000 | PACK | Freq: Once | INTRAVENOUS | Status: AC | PRN
  Administered 2017-04-23: 30.2 via INTRAVENOUS
  Filled 2017-04-23: qty 31

## 2017-04-23 MED ORDER — REGADENOSON 0.4 MG/5ML IV SOLN
0.4000 mg | Freq: Once | INTRAVENOUS | Status: AC
  Administered 2017-04-23: 0.4 mg via INTRAVENOUS

## 2017-04-23 MED ORDER — TECHNETIUM TC 99M TETROFOSMIN IV KIT
10.8000 | PACK | Freq: Once | INTRAVENOUS | Status: AC | PRN
  Administered 2017-04-23: 10.8 via INTRAVENOUS
  Filled 2017-04-23: qty 11

## 2017-05-20 ENCOUNTER — Telehealth: Payer: Self-pay | Admitting: Cardiology

## 2017-05-20 NOTE — Telephone Encounter (Signed)
Faxed Release signed by patient to Dr Joycelyn Rua to obtain records per Dr Stanford Breed.  Faxed on 05/20/17. lp

## 2017-06-05 ENCOUNTER — Telehealth: Payer: Self-pay | Admitting: Cardiology

## 2017-06-05 NOTE — Telephone Encounter (Signed)
Received incoming records from Dr. Rollene Fare Chiu's office. Records given to [place], [place]. 06/05/17 ab

## 2017-06-18 ENCOUNTER — Other Ambulatory Visit: Payer: Self-pay

## 2017-06-18 ENCOUNTER — Ambulatory Visit (INDEPENDENT_AMBULATORY_CARE_PROVIDER_SITE_OTHER): Payer: Medicare Other | Admitting: Physician Assistant

## 2017-06-18 ENCOUNTER — Encounter (INDEPENDENT_AMBULATORY_CARE_PROVIDER_SITE_OTHER): Payer: Self-pay | Admitting: Physician Assistant

## 2017-06-18 VITALS — BP 139/91 | HR 86 | Temp 97.8°F | Wt 236.0 lb

## 2017-06-18 DIAGNOSIS — K0889 Other specified disorders of teeth and supporting structures: Secondary | ICD-10-CM | POA: Diagnosis not present

## 2017-06-18 DIAGNOSIS — I2510 Atherosclerotic heart disease of native coronary artery without angina pectoris: Secondary | ICD-10-CM

## 2017-06-18 DIAGNOSIS — Z1159 Encounter for screening for other viral diseases: Secondary | ICD-10-CM | POA: Diagnosis not present

## 2017-06-18 DIAGNOSIS — Z720 Tobacco use: Secondary | ICD-10-CM | POA: Diagnosis not present

## 2017-06-18 DIAGNOSIS — Z1211 Encounter for screening for malignant neoplasm of colon: Secondary | ICD-10-CM | POA: Diagnosis not present

## 2017-06-18 DIAGNOSIS — E7841 Elevated Lipoprotein(a): Secondary | ICD-10-CM

## 2017-06-18 DIAGNOSIS — Z114 Encounter for screening for human immunodeficiency virus [HIV]: Secondary | ICD-10-CM | POA: Diagnosis not present

## 2017-06-18 MED ORDER — AMOXICILLIN-POT CLAVULANATE 875-125 MG PO TABS
1.0000 | ORAL_TABLET | Freq: Two times a day (BID) | ORAL | 0 refills | Status: DC
Start: 2017-06-18 — End: 2017-08-11

## 2017-06-18 MED ORDER — NAPROXEN SODIUM 220 MG PO CAPS: 1.0000 | ORAL_CAPSULE | Freq: Two times a day (BID) | ORAL | 0 refills | Status: AC | PRN

## 2017-06-18 MED ORDER — HYDROCODONE-ACETAMINOPHEN 5-325 MG PO TABS: 1.0000 | ORAL_TABLET | Freq: Four times a day (QID) | ORAL | 0 refills | Status: DC | PRN

## 2017-06-18 NOTE — Patient Instructions (Signed)
Dental Pain Dental pain may be caused by many things, including:  Tooth decay (cavities or caries). Cavities cause the nerve of your tooth to be open to air and hot or cold temperatures. This can cause pain or discomfort.  Abscess or infection. A dental abscess is an area that is full of infected pus from a bacterial infection in the inner part of the tooth (pulp). It usually happens at the end of the tooth's root.  Injury.  An unknown reason (idiopathic).  Your pain may be mild or severe. It may only happen when:  You are chewing.  You are exposed to hot or cold temperature.  You are eating or drinking sugary foods or beverages, such as: ? Soda. ? Candy.  Your pain may also be there all of the time. Follow these instructions at home: Watch your dental pain for any changes. Do these things to lessen your discomfort:  Take medicines only as told by your dentist.  If your dentist tells you to take an antibiotic medicine, finish all of it even if you start to feel better.  Keep all follow-up visits as told by your dentist. This is important.  Do not apply heat to the outside of your face.  Rinse your mouth or gargle with salt water if told by your dentist. This helps with pain and swelling. ? You can make salt water by adding  tsp of salt to 1 cup of warm water.  Apply ice to the painful area of your face: ? Put ice in a plastic bag. ? Place a towel between your skin and the bag. ? Leave the ice on for 20 minutes, 2-3 times per day.  Avoid foods or drinks that cause you pain, such as: ? Very hot or very cold foods or drinks. ? Sweet or sugary foods or drinks.  Contact a doctor if:  Your pain is not helped with medicines.  Your symptoms are worse.  You have new symptoms. Get help right away if:  You cannot open your mouth.  You are having trouble breathing or swallowing.  You have a fever.  Your face, neck, or jaw is puffy (swollen). This information is not  intended to replace advice given to you by your health care provider. Make sure you discuss any questions you have with your health care provider. Document Released: 01/01/2008 Document Revised: 12/21/2015 Document Reviewed: 07/11/2014 Elsevier Interactive Patient Education  2018 [employer]  
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
## 2017-06-19 LAB — HEPATITIS C ANTIBODY (REFLEX): HCV Ab: <0.1 s/co ratio (ref 0.0–0.9)

## 2017-06-19 LAB — HIV ANTIBODY (ROUTINE TESTING W REFLEX): HIV Screen 4th Generation wRfx: NONREACTIVE

## 2017-06-19 LAB — HCV COMMENT:

## 2017-06-23 ENCOUNTER — Telehealth (INDEPENDENT_AMBULATORY_CARE_PROVIDER_SITE_OTHER): Payer: Self-pay

## 2017-06-23 NOTE — Telephone Encounter (Signed)
Patient aware of negative HCV and HEP C . Nat Christen, CMA

## 2017-06-23 NOTE — Telephone Encounter (Signed)
-----   Message from [name], PA-C sent at 06/23/2017  1:51 PM EST ----- HCV and HIV are negative.

## 2017-08-08 ENCOUNTER — Other Ambulatory Visit: Payer: Self-pay | Admitting: Urology

## 2017-08-08 DIAGNOSIS — C61 Malignant neoplasm of prostate: Secondary | ICD-10-CM

## 2017-08-08 DIAGNOSIS — R319 Hematuria, unspecified: Secondary | ICD-10-CM

## 2017-08-11 ENCOUNTER — Encounter (HOSPITAL_BASED_OUTPATIENT_CLINIC_OR_DEPARTMENT_OTHER): Payer: Self-pay | Admitting: *Deleted

## 2017-08-13 ENCOUNTER — Ambulatory Visit (INDEPENDENT_AMBULATORY_CARE_PROVIDER_SITE_OTHER): Payer: Medicare Other | Admitting: Physician Assistant

## 2017-08-13 ENCOUNTER — Encounter (HOSPITAL_BASED_OUTPATIENT_CLINIC_OR_DEPARTMENT_OTHER): Payer: Self-pay

## 2017-08-13 ENCOUNTER — Encounter (INDEPENDENT_AMBULATORY_CARE_PROVIDER_SITE_OTHER): Payer: Self-pay | Admitting: Physician Assistant

## 2017-08-13 ENCOUNTER — Other Ambulatory Visit: Payer: Self-pay

## 2017-08-13 VITALS — BP 121/84 | HR 70 | Temp 97.3°F | Wt 225.0 lb

## 2017-08-13 DIAGNOSIS — K047 Periapical abscess without sinus: Secondary | ICD-10-CM | POA: Diagnosis not present

## 2017-08-13 MED ORDER — DOXYCYCLINE HYCLATE 100 MG PO TABS: 100.0000 mg | ORAL_TABLET | Freq: Two times a day (BID) | ORAL | 0 refills | Status: DC

## 2017-08-13 NOTE — Progress Notes (Signed)
Pt states abcess busted last night

## 2017-08-13 NOTE — Progress Notes (Signed)
Spoke with:  Dalton NPO: After Midnight no gum, candy, mints Arrival time: 0800AM Labs: Istat8 AM medications: Escitalopram, Metoprolol Pre op orders: Yes Ride home: Josph Macho (daughter) [phone]

## 2017-08-13 NOTE — Patient Instructions (Signed)
Dental Abscess A dental abscess is pus in or around a tooth. Follow these instructions at home:  Take medicines only as told by your dentist.  If you were prescribed antibiotic medicine, finish all of it even if you start to feel better.  Rinse your mouth (gargle) often with salt water.  Do not drive or use heavy machinery, like a lawn mower, while taking pain medicine.  Do not apply heat to the outside of your mouth.  Keep all follow-up visits as told by your dentist. This is important. Contact a doctor if:  Your pain is worse, and medicine does not help. Get help right away if:  You have a fever or chills.  Your symptoms suddenly get worse.  You have a very bad headache.  You have problems breathing or swallowing.  You have trouble opening your mouth.  You have puffiness (swelling) in your neck or around your eye. This information is not intended to replace advice given to you by your health care provider. Make sure you discuss any questions you have with your health care provider. Document Released: 11/29/2014 Document Revised: 12/21/2015 Document Reviewed: 07/12/2014 Elsevier Interactive Patient Education  2018 [employer] Inc[employer]  

## 2017-08-13 NOTE — Progress Notes (Signed)
Subjective:  Patient ID: Stephen Stone, male    DOB: [birth year]  Age: 62 y.o. MRN: [id number]  CC: oral abscess  HPI Stephen Stone is a 62 y.o. male with a medical history of blindness, CAD, PCI s/p stent 09/2102, Gleason 6 prostate cancer, and tobacco abuse presents with dental abscess. Abscess has self drained and now has dental pain. Has extremely poor dentition and is awaiting dental coverage for dental extraction and eventual replacement with dentures. Denies CP, palpitations, rash, f/c/n/v, throat swelling.      Outpatient Medications Prior to Visit  Medication Sig Dispense Refill  . aspirin 325 MG EC tablet Take 1 tablet (325 mg total) by mouth daily. 90 tablet 30  . atorvastatin (LIPITOR) 80 MG tablet Take 1 tablet (80 mg total) by mouth every evening. 90 tablet 3  . escitalopram (LEXAPRO) 20 MG tablet Take 1 tablet (20 mg total) by mouth daily. 90 tablet 3  . folic acid (FOLVITE) 397 MCG tablet Take 1 tablet (400 mcg total) by mouth daily. 90 tablet 3  . lisinopril (PRINIVIL,ZESTRIL) 20 MG tablet Take 1 tablet (20 mg total) by mouth daily. 90 tablet 3  . metoprolol succinate (TOPROL-XL) 100 MG 24 hr tablet Take 1 tablet (100 mg total) by mouth daily. Take with or immediately following a meal. 90 tablet 3  . Misc Natural Products (PROSTATE HEALTH PO) Take 1 tablet by mouth daily.    . QUEtiapine (SEROQUEL) 200 MG tablet Take 1 tablet (200 mg total) by mouth at bedtime. 90 tablet 3   No facility-administered medications prior to visit.      ROS Review of Systems  Constitutional: Negative for chills, fever and malaise/fatigue.  HENT:       Dental pain  Eyes: Negative for blurred vision.  Respiratory: Negative for shortness of breath.   Cardiovascular: Negative for chest pain and palpitations.  Gastrointestinal: Negative for abdominal pain and nausea.  Genitourinary: Negative for dysuria and hematuria.  Musculoskeletal: Negative for joint pain and myalgias.  Skin:  Negative for rash.  Neurological: Negative for tingling and headaches.  Psychiatric/Behavioral: Negative for depression. The patient is not nervous/anxious.     Objective:  BP 121/84 (BP Location: Left Arm, Patient Position: Sitting, Cuff Size: Normal)   Pulse 70   Temp (!) 97.3 F (36.3 C) (Oral)   Wt 225 lb (102.1 kg)   SpO2 96%   BMI 30.52 kg/m   BP/Weight 08/13/2017 08/13/2017 67/34/1937  Systolic BP 902 - 409  Diastolic BP 84 - 91  Wt. (Lbs) 225 250 236  BMI 30.52 - 32.01      Physical Exam  Constitutional: He is oriented to person, place, and time.  Well developed, well nourished, NAD, polite, blind  HENT:  Head: Normocephalic and atraumatic.  Extremely poor dentition. Severe periodontal disease. Abscess located near tooth number 12 appears self drained. No bleeding or suppuration.   Eyes: No scleral icterus.  Neck: Normal range of motion. Neck supple. No thyromegaly present.  Cardiovascular: Normal rate, regular rhythm and normal heart sounds.  Pulmonary/Chest: Effort normal and breath sounds normal.  Musculoskeletal: He exhibits no edema.  Neurological: He is alert and oriented to person, place, and time.  Skin: Skin is warm and dry. No rash noted. No erythema. No pallor.  Psychiatric: He has a normal mood and affect. His behavior is normal. Thought content normal.  Vitals reviewed.    Assessment & Plan:    1. Dental abscess - doxycycline (VIBRA-TABS) 100 MG tablet; Take  1 tablet (100 mg total) by mouth 2 (two) times daily.  Dispense: 20 tablet; Refill: 0 - I advised for patient to call when he feels an infection/pain setting in.    Meds ordered this encounter  Medications  . doxycycline (VIBRA-TABS) 100 MG tablet    Sig: Take 1 tablet (100 mg total) by mouth 2 (two) times daily.    Dispense:  20 tablet    Refill:  0    Order Specific Question:   Supervising Provider    Answer:   Tresa Garter [id number]    Follow-up: Return if symptoms worsen  or fail to improve.   [name] PA

## 2017-08-18 NOTE — H&P (Signed)
CC: I have prostate cancer.  HPI: Stephen Stone is a 62 year-old male patient who was referred by Domenica Fail, PA who is here evaluation for treatment of prostate cancer.  His prostate cancer was diagnosed approximately 05/29/2016. His PSA at his time of diagnosis was 72. His most recent PSA is 18.4.   He has not undergone surgery for treatment. He has not undergone External Beam Radiation Therapy for treatment. He has not undergone Hormonal Therapy for treatment.   He does not have urinary incontinence. He has not recently had unwanted weight loss. He is not having pain in new locations.   Stephen Stone is a 49 yo BM who was diagnosed with a Gleason 6 prostate cancer in [place] in late 2017. He moved here in April. He was going to have a seed implant but didn't get that done. His PSA is now 18.4. His PSA was 19 prior to diagnosis. He has no voiding complaints other than nocturia x 2. He has had no UTI's, stones or GU surgery.      AUA Symptom Score: He never has the sensation of not emptying his bladder completely after finishing urinating. He never has to urinate again less that two hours after he has finished urinating. He does not have to stop and start again several times when he urinates. He never finds it difficult to postpone urination. He never has a weak urinary stream. He never has to push or strain to begin urination. He has to get up to urinate 2 times from the time he goes to bed until the time he gets up in the morning.   Calculated AUA Symptom Score: 2    QOL Score: He would feel pleased if he had to live with his urinary condition the way it is now for the rest of his life.   Calculated QOL Symptom Score: 1    ALLERGIES: None   MEDICATIONS: Lisinopril 20 mg tablet  Metoprolol Succinate 100 mg tablet, extended release 24 hr  Adult Aspirin 81 mg tablet, delayed release  Lexapro 20 mg tablet  Seroquel     GU PSH: None   NON-GU PSH: Cardiac Stent Placement    GU  PMH: None   NON-GU PMH: Anxiety Glaucoma Myocardial Infarction    FAMILY HISTORY: Acute Myocardial Infarction - Father Death of family member - Mother, Father Prostate Cancer - Brother    Notes: 3 sons; 4 daughters   SOCIAL HISTORY: Marital Status: Unknown Preferred Language: English; Ethnicity: Not Hispanic Or Latino; Race: Black or African American Current Smoking Status: Patient smokes. Has smoked since 07/30/2011. Smokes 1/2 pack per day.   Tobacco Use Assessment Completed: Used Tobacco in last 30 days? Does not use smokeless tobacco. Has never drank.  Drinks 3 caffeinated drinks per day. Patient's occupation is/was disabled.    REVIEW OF SYSTEMS:    GU Review Male:   Patient reports get up at night to urinate. Patient denies frequent urination, hard to postpone urination, burning/ pain with urination, leakage of urine, stream starts and stops, trouble starting your stream, have to strain to urinate , erection problems, and penile pain.  Gastrointestinal (Upper):   Patient denies nausea, vomiting, and indigestion/ heartburn.  Gastrointestinal (Lower):   Patient denies diarrhea and constipation.  Constitutional:   Patient denies fever, night sweats, weight loss, and fatigue.  Skin:   Patient denies skin rash/ lesion and itching.  Eyes:   Blind. Patient denies blurred vision and double vision.  Ears/ Nose/ Throat:  Patient denies sore throat and sinus problems.  Hematologic/Lymphatic:   Patient denies easy bruising and swollen glands.  Cardiovascular:   Patient denies leg swelling and chest pains.  Respiratory:   Patient denies cough and shortness of breath.  Endocrine:   Patient denies excessive thirst.  Musculoskeletal:   Patient denies back pain and joint pain.  Neurological:   Patient denies headaches and dizziness.  Psychologic:   Patient denies depression and anxiety.   VITAL SIGNS:      08/06/2017 01:47 PM  Weight 246 lb / 111.58 kg  Height 72 in / 182.88 cm  BP  143/96 mmHg  Pulse 75 /min  Temperature 97.5 F / 36.3 C  BMI 33.4 kg/m   GU PHYSICAL EXAMINATION:    Anus and Perineum: No hemorrhoids. No anal stenosis. No rectal fissure, no anal fissure. No edema, no dimple, no perineal tenderness, no anal tenderness.  Scrotum: No lesions. No edema. No cysts. No warts.  Epididymides: Right: no spermatocele, no masses, no cysts, no tenderness, no induration, no enlargement. Left: no spermatocele, no masses, no cysts, no tenderness, no induration, no enlargement.  Testes: No tenderness, no swelling, no enlargement left testes. No tenderness, no swelling, no enlargement right testes. Normal location left testes. Normal location right testes. No mass, no cyst, no varicocele, no hydrocele left testes. No mass, no cyst, no varicocele, no hydrocele right testes.  Urethral Meatus: Normal size. No lesion, no wart, no discharge, no polyp. Normal location.  Penis: Circumcised, no warts, no cracks. No dorsal Peyronie's plaques, no left corporal Peyronie's plaques, no right corporal Peyronie's plaques, no scarring, no warts. No balanitis, no meatal stenosis.  Prostate: Prostate 2 1/2+ size. firm at the right base without nodule.   Seminal Vesicles: Nonpalpable.  Sphincter Tone: Normal sphincter. No rectal tenderness. No rectal mass.    MULTI-SYSTEM PHYSICAL EXAMINATION:    Constitutional: Well-nourished. No physical deformities. Normally developed. Good grooming.  Neck: Neck symmetrical, not swollen. Normal tracheal position.  Respiratory: No labored breathing, no use of accessory muscles. CTA  Cardiovascular: Normal temperature, RRR without murmur  Lymphatic: No enlargement of neck, axillae, groin.  Skin: No paleness, no jaundice, no cyanosis. No lesion, no ulcer, no rash.  Neurologic / Psychiatric: Oriented to time, oriented to place, oriented to person. No depression, no anxiety, no agitation.  Gastrointestinal: Obese abdomen. No hernia. No mass, no tenderness, no  rigidity.   Musculoskeletal: Normal gait and station of head and neck.     PAST DATA REVIEWED:  Source Of History:  Patient  Lab Test Review:   PSA  Records Review:   AUA Symptom Score, Previous Doctor Records  Urine Test Review:   Urinalysis  Notes:                     I have reviewed his notes from his PCP from 8/18. I will request records from his prior Urologist in [place].    PROCEDURES:          Urinalysis w/Scope Dipstick Dipstick Cont'd Micro  Color: Yellow Bilirubin: Neg WBC/hpf: 0 - 5/hpf  Appearance: Cloudy Ketones: Neg RBC/hpf: 3 - 10/hpf  Specific Gravity: 1.025 Blood: Neg Bacteria: NS (Not Seen)  pH: 6.0 Protein: Neg Cystals: NS (Not Seen)  Glucose: Neg Urobilinogen: 0.2 Casts: NS (Not Seen)    Nitrites: Neg Trichomonas: Not Present    Leukocyte Esterase: Neg Mucous: Present      Epithelial Cells: NS (Not Seen)  Yeast: NS (Not Seen)      Sperm: Not Present    ASSESSMENT:      ICD-10 Details  1 GU:   Prostate Cancer - C61 He has a history of Gleason 6 prostate cancer. By report his PSA has been stable. He has some induration of the base of the prostate but is voiding well. I am going to get records from his prior Urologist in [place] and will obtain a PSA today. Since it has been over a year since his biopsy, I will get him set up for a repeat biopsy and as an outpatient. he declines in office biopsy. I reviewed the risks of bleeding, infection and voiding difficulty. He was given Levaquin for the prep.   2   Elevated PSA - R97.20   3   Prostate nodule w/o LUTS - N40.2   4   Microscopic hematuria - R31.21 He has 3-10 RBC's on UA today. I am going to get a BMP and set him up for a CT hematuria study. I will do cystoscopy at the time of the prostate biopsy and reviewed the risks of the procedure.    PLAN:            Medications New Meds: Levaquin 750 mg tablet 1 tablet PO Daily Take 1 hour prior to procedure.  #1  0 Refill(s)            Orders Labs  PSA, BMP          Schedule X-Rays: C.T. Hematuria With and Without I.V. Contrast - Next available.   Return Visit/Planned Activity: Next Available Appointment - Schedule Surgery

## 2017-08-19 ENCOUNTER — Encounter (HOSPITAL_BASED_OUTPATIENT_CLINIC_OR_DEPARTMENT_OTHER)
Admission: RE | Disposition: A | Discharge status: Home / Self Care | Payer: Self-pay | Source: Ambulatory Visit | Attending: Urology

## 2017-08-19 ENCOUNTER — Ambulatory Visit (HOSPITAL_BASED_OUTPATIENT_CLINIC_OR_DEPARTMENT_OTHER): Payer: Medicare Other | Admitting: Anesthesiology

## 2017-08-19 ENCOUNTER — Ambulatory Visit (HOSPITAL_BASED_OUTPATIENT_CLINIC_OR_DEPARTMENT_OTHER)
Admission: RE | Admit: 2017-08-19 | Discharge: 2017-08-19 | Disposition: A | Discharge status: Home / Self Care | Payer: Medicare Other | Source: Ambulatory Visit | Attending: Urology | Admitting: Urology

## 2017-08-19 ENCOUNTER — Encounter (HOSPITAL_BASED_OUTPATIENT_CLINIC_OR_DEPARTMENT_OTHER): Payer: Self-pay

## 2017-08-19 ENCOUNTER — Ambulatory Visit (HOSPITAL_COMMUNITY)
Admission: RE | Admit: 2017-08-19 | Discharge: 2017-08-19 | Disposition: A | Discharge status: Home / Self Care | Payer: Medicare Other | Source: Ambulatory Visit | Attending: Urology | Admitting: Urology

## 2017-08-19 DIAGNOSIS — I10 Essential (primary) hypertension: Secondary | ICD-10-CM | POA: Diagnosis not present

## 2017-08-19 DIAGNOSIS — N3289 Other specified disorders of bladder: Secondary | ICD-10-CM | POA: Diagnosis not present

## 2017-08-19 DIAGNOSIS — Z8249 Family history of ischemic heart disease and other diseases of the circulatory system: Secondary | ICD-10-CM | POA: Insufficient documentation

## 2017-08-19 DIAGNOSIS — F419 Anxiety disorder, unspecified: Secondary | ICD-10-CM | POA: Diagnosis not present

## 2017-08-19 DIAGNOSIS — Z8042 Family history of malignant neoplasm of prostate: Secondary | ICD-10-CM | POA: Insufficient documentation

## 2017-08-19 DIAGNOSIS — I252 Old myocardial infarction: Secondary | ICD-10-CM | POA: Diagnosis not present

## 2017-08-19 DIAGNOSIS — C61 Malignant neoplasm of prostate: Secondary | ICD-10-CM

## 2017-08-19 DIAGNOSIS — F17200 Nicotine dependence, unspecified, uncomplicated: Secondary | ICD-10-CM | POA: Insufficient documentation

## 2017-08-19 DIAGNOSIS — I2510 Atherosclerotic heart disease of native coronary artery without angina pectoris: Secondary | ICD-10-CM | POA: Insufficient documentation

## 2017-08-19 DIAGNOSIS — Z955 Presence of coronary angioplasty implant and graft: Secondary | ICD-10-CM | POA: Diagnosis not present

## 2017-08-19 DIAGNOSIS — Z79899 Other long term (current) drug therapy: Secondary | ICD-10-CM | POA: Diagnosis not present

## 2017-08-19 DIAGNOSIS — R319 Hematuria, unspecified: Secondary | ICD-10-CM

## 2017-08-19 DIAGNOSIS — R3129 Other microscopic hematuria: Secondary | ICD-10-CM | POA: Diagnosis present

## 2017-08-19 DIAGNOSIS — N323 Diverticulum of bladder: Secondary | ICD-10-CM | POA: Diagnosis not present

## 2017-08-19 DIAGNOSIS — H548 Legal blindness, as defined in USA: Secondary | ICD-10-CM | POA: Diagnosis not present

## 2017-08-19 DIAGNOSIS — H409 Unspecified glaucoma: Secondary | ICD-10-CM | POA: Diagnosis not present

## 2017-08-19 HISTORY — DX: Depression, unspecified: F32.A

## 2017-08-19 HISTORY — PX: PROSTATE BIOPSY: SHX241

## 2017-08-19 HISTORY — DX: Major depressive disorder, single episode, unspecified: F32.9

## 2017-08-19 HISTORY — PX: CYSTOSCOPY: SHX5120

## 2017-08-19 HISTORY — DX: Legal blindness, as defined in USA: H54.8

## 2017-08-19 HISTORY — DX: Unspecified glaucoma: H40.9

## 2017-08-19 HISTORY — DX: Malignant neoplasm of prostate: C61

## 2017-08-19 HISTORY — DX: Anxiety disorder, unspecified: F41.9

## 2017-08-19 LAB — POCT I-STAT, CHEM 8
BUN: 12 mg/dL (ref 6–20)
CHLORIDE: 106 mmol/L (ref 101–111)
CREATININE: 0.9 mg/dL (ref 0.61–1.24)
Calcium, Ion: 1.28 mmol/L (ref 1.15–1.40)
GLUCOSE: 137 mg/dL — AB (ref 65–99)
HEMATOCRIT: 41 % (ref 39.0–52.0)
HEMOGLOBIN: 13.9 g/dL (ref 13.0–17.0)
Potassium: 3.9 mmol/L (ref 3.5–5.1)
Sodium: 141 mmol/L (ref 135–145)
TCO2: 20 mmol/L — ABNORMAL LOW (ref 22–32)

## 2017-08-19 SURGERY — BIOPSY, PROSTATE, RECTAL APPROACH, WITH US GUIDANCE
Anesthesia: General

## 2017-08-19 MED ORDER — FENTANYL CITRATE (PF) 100 MCG/2ML IJ SOLN
25.0000 ug | INTRAMUSCULAR | Status: DC | PRN
  Filled 2017-08-19: qty 1

## 2017-08-19 MED ORDER — SODIUM CHLORIDE 0.9% FLUSH
3.0000 mL | INTRAVENOUS | Status: DC | PRN
  Filled 2017-08-19: qty 3

## 2017-08-19 MED ORDER — MIDAZOLAM HCL 2 MG/2ML IJ SOLN
INTRAMUSCULAR | Status: AC
  Filled 2017-08-19: qty 2

## 2017-08-19 MED ORDER — CEFTRIAXONE SODIUM 1 G IJ SOLR
INTRAMUSCULAR | Status: AC
  Filled 2017-08-19: qty 10

## 2017-08-19 MED ORDER — ONDANSETRON HCL 4 MG/2ML IJ SOLN
INTRAMUSCULAR | Status: AC
  Filled 2017-08-19: qty 2

## 2017-08-19 MED ORDER — SODIUM CHLORIDE 0.9% FLUSH
3.0000 mL | Freq: Two times a day (BID) | INTRAVENOUS | Status: DC
  Filled 2017-08-19: qty 3

## 2017-08-19 MED ORDER — LIDOCAINE 2% (20 MG/ML) 5 ML SYRINGE
INTRAMUSCULAR | Status: AC
Start: 2017-08-19 — End: 2017-08-19
  Filled 2017-08-19: qty 5

## 2017-08-19 MED ORDER — DEXTROSE 5 % IV SOLN
5.0000 mg/kg | INTRAVENOUS | Status: DC
  Administered 2017-08-19: 440 mg via INTRAVENOUS
  Filled 2017-08-19 (×2): qty 11

## 2017-08-19 MED ORDER — ONDANSETRON HCL 4 MG/2ML IJ SOLN
INTRAMUSCULAR | Status: DC | PRN
  Administered 2017-08-19: 4 mg via INTRAVENOUS

## 2017-08-19 MED ORDER — DEXTROSE 5 % IV SOLN
1.0000 g | INTRAVENOUS | Status: AC
  Administered 2017-08-19: 1 g via INTRAVENOUS
  Filled 2017-08-19: qty 10

## 2017-08-19 MED ORDER — GENTAMICIN SULFATE 40 MG/ML IJ SOLN
5.0000 mg/kg | INTRAMUSCULAR | Status: DC
  Filled 2017-08-19: qty 13.5

## 2017-08-19 MED ORDER — FENTANYL CITRATE (PF) 100 MCG/2ML IJ SOLN
INTRAMUSCULAR | Status: DC | PRN
  Administered 2017-08-19: 25 ug via INTRAVENOUS
  Administered 2017-08-19: 50 ug via INTRAVENOUS

## 2017-08-19 MED ORDER — OXYCODONE HCL 5 MG PO TABS
5.0000 mg | ORAL_TABLET | Freq: Once | ORAL | Status: AC | PRN
  Administered 2017-08-19: 5 mg via ORAL
  Filled 2017-08-19: qty 1

## 2017-08-19 MED ORDER — OXYCODONE HCL 5 MG/5ML PO SOLN
5.0000 mg | Freq: Once | ORAL | Status: AC | PRN
  Filled 2017-08-19: qty 5

## 2017-08-19 MED ORDER — ACETAMINOPHEN 325 MG PO TABS
650.0000 mg | ORAL_TABLET | ORAL | Status: DC | PRN
  Filled 2017-08-19: qty 2

## 2017-08-19 MED ORDER — DEXAMETHASONE SODIUM PHOSPHATE 10 MG/ML IJ SOLN
INTRAMUSCULAR | Status: AC
  Filled 2017-08-19: qty 1

## 2017-08-19 MED ORDER — ACETAMINOPHEN 650 MG RE SUPP
650.0000 mg | RECTAL | Status: DC | PRN
  Filled 2017-08-19: qty 1

## 2017-08-19 MED ORDER — DEXAMETHASONE SODIUM PHOSPHATE 10 MG/ML IJ SOLN
INTRAMUSCULAR | Status: DC | PRN
  Administered 2017-08-19: 10 mg via INTRAVENOUS

## 2017-08-19 MED ORDER — TRAMADOL HCL 50 MG PO TABS: 50.0000 mg | ORAL_TABLET | Freq: Four times a day (QID) | ORAL | 0 refills | Status: DC | PRN

## 2017-08-19 MED ORDER — SODIUM CHLORIDE 0.9 % IV SOLN
INTRAVENOUS | Status: DC
  Administered 2017-08-19: 09:00:00 via INTRAVENOUS
  Filled 2017-08-19: qty 1000

## 2017-08-19 MED ORDER — LIDOCAINE 2% (20 MG/ML) 5 ML SYRINGE
INTRAMUSCULAR | Status: DC | PRN
  Administered 2017-08-19: 100 mg via INTRAVENOUS

## 2017-08-19 MED ORDER — PROPOFOL 10 MG/ML IV BOLUS
INTRAVENOUS | Status: AC
  Filled 2017-08-19: qty 40

## 2017-08-19 MED ORDER — PROMETHAZINE HCL 25 MG/ML IJ SOLN
6.2500 mg | INTRAMUSCULAR | Status: DC | PRN
  Filled 2017-08-19: qty 1

## 2017-08-19 MED ORDER — SODIUM CHLORIDE 0.9 % IV SOLN
250.0000 mL | INTRAVENOUS | Status: DC | PRN
  Filled 2017-08-19: qty 250

## 2017-08-19 MED ORDER — OXYCODONE HCL 5 MG PO TABS
5.0000 mg | ORAL_TABLET | ORAL | Status: DC | PRN
  Filled 2017-08-19: qty 2

## 2017-08-19 MED ORDER — MEPERIDINE HCL 25 MG/ML IJ SOLN
6.2500 mg | INTRAMUSCULAR | Status: DC | PRN
  Filled 2017-08-19: qty 1

## 2017-08-19 MED ORDER — OXYCODONE HCL 5 MG PO TABS
ORAL_TABLET | ORAL | Status: AC
  Filled 2017-08-19: qty 1

## 2017-08-19 MED ORDER — MORPHINE SULFATE (PF) 2 MG/ML IV SOLN
1.0000 mg | INTRAVENOUS | Status: DC | PRN
  Filled 2017-08-19: qty 0.5

## 2017-08-19 MED ORDER — PROPOFOL 10 MG/ML IV BOLUS
INTRAVENOUS | Status: DC | PRN
  Administered 2017-08-19: 140 mg via INTRAVENOUS
  Administered 2017-08-19: 60 mg via INTRAVENOUS

## 2017-08-19 MED ORDER — FENTANYL CITRATE (PF) 100 MCG/2ML IJ SOLN
INTRAMUSCULAR | Status: AC
  Filled 2017-08-19: qty 2

## 2017-08-19 MED ORDER — DEXTROSE 5 % IV SOLN
INTRAVENOUS | Status: AC
  Filled 2017-08-19: qty 50

## 2017-08-19 SURGICAL SUPPLY — 33 items
BAG DRAIN URO-CYSTO SKYTR STRL (DRAIN) ×3 IMPLANT
CATH FOLEY 2W COUNCIL 5CC 18FR (CATHETERS) ×3 IMPLANT
CATH FOLEY 2WAY SLVR  5CC 16FR (CATHETERS)
CATH FOLEY 2WAY SLVR 5CC 16FR (CATHETERS) IMPLANT
CATH URET 5FR 28IN OPEN ENDED (CATHETERS) IMPLANT
CLOTH BEACON ORANGE TIMEOUT ST (SAFETY) ×3 IMPLANT
ELECT REM PT RETURN 9FT ADLT (ELECTROSURGICAL) ×3
ELECTRODE REM PT RTRN 9FT ADLT (ELECTROSURGICAL) ×1 IMPLANT
GLOVE SURG SS PI 8.0 STRL IVOR (GLOVE) ×3 IMPLANT
GOWN STRL REUS W/ TWL LRG LVL3 (GOWN DISPOSABLE) ×1 IMPLANT
GOWN STRL REUS W/ TWL XL LVL3 (GOWN DISPOSABLE) ×1 IMPLANT
GOWN STRL REUS W/TWL LRG LVL3 (GOWN DISPOSABLE) ×2
GOWN STRL REUS W/TWL XL LVL3 (GOWN DISPOSABLE) ×2
GUIDEWIRE STR DUAL SENSOR (WIRE) ×3 IMPLANT
INST BIOPSY MAXCORE 18GX25 (NEEDLE) IMPLANT
INSTR BIOPSY MAXCORE 18GX20 (NEEDLE) ×3 IMPLANT
KIT RM TURNOVER CYSTO AR (KITS) ×3 IMPLANT
MANIFOLD NEPTUNE II (INSTRUMENTS) ×3 IMPLANT
NDL SAFETY ECLIPSE 18X1.5 (NEEDLE) IMPLANT
NEEDLE HYPO 18GX1.5 SHARP (NEEDLE)
NEEDLE HYPO 22GX1.5 SAFETY (NEEDLE) IMPLANT
NEEDLE SPNL 22GX7 QUINCKE BK (NEEDLE) IMPLANT
NS IRRIG 500ML POUR BTL (IV SOLUTION) IMPLANT
PACK CYSTO (CUSTOM PROCEDURE TRAY) ×3 IMPLANT
SURGILUBE 2OZ TUBE FLIPTOP (MISCELLANEOUS) ×3 IMPLANT
SYR 20CC LL (SYRINGE) IMPLANT
SYR BULB IRRIGATION 50ML (SYRINGE) ×3 IMPLANT
SYR CONTROL 10ML LL (SYRINGE) IMPLANT
TOWEL OR 17X24 6PK STRL BLUE (TOWEL DISPOSABLE) ×3 IMPLANT
TUBE CONNECTING 12'X1/4 (SUCTIONS) ×1
TUBE CONNECTING 12X1/4 (SUCTIONS) ×2 IMPLANT
UNDERPAD 30X30 INCONTINENT (UNDERPADS AND DIAPERS) ×3 IMPLANT
WATER STERILE IRR 3000ML UROMA (IV SOLUTION) ×3 IMPLANT

## 2017-08-19 NOTE — Interval H&P Note (Signed)
History and Physical Interval Note:  08/19/2017 10:31 AM  Stephen Stone  has presented today for surgery, with the diagnosis of PROSTATE CANCER HEMATURIA  The various methods of treatment have been discussed with the patient and family. After consideration of risks, benefits and other options for treatment, the patient has consented to  Procedure(s): BIOPSY TRANSRECTAL ULTRASONIC PROSTATE (TUBP) (N/A) CYSTOSCOPY (N/A) as a surgical intervention .  The patient's history has been reviewed, patient examined, no change in status, stable for surgery.  I have reviewed the patient's chart and labs.  Questions were answered to the patient's satisfaction.     [name]

## 2017-08-19 NOTE — Anesthesia Preprocedure Evaluation (Addendum)
Anesthesia Evaluation  Patient identified by MRN, date of birth, ID band Patient awake    Reviewed: Allergy & Precautions, NPO status , Patient's Chart, lab work & pertinent test results  Airway Mallampati: II  TM Distance: >3 FB Neck ROM: Limited    Dental  (+) Poor Dentition, Dental Advisory Given, Chipped, Missing   Pulmonary Current Smoker,    Pulmonary exam normal        Cardiovascular hypertension, Pt. on medications and Pt. on home beta blockers + CAD, + Past MI and + Cardiac Stents   Rhythm:Regular Rate:Normal  NM Stress 03/2017 Low Risk Nuclear perfusion study w/ no evidence ischemia/  normal LV function and wall motion,  nuclear stress ef 58%   Neuro/Psych PSYCHIATRIC DISORDERS Anxiety Depression negative neurological ROS     GI/Hepatic negative GI ROS, Neg liver ROS, Pt admitted to 6 oz V-8 juice @ 7am. Surgery delayed for 6 hrs.   Endo/Other  negative endocrine ROS  Renal/GU negative Renal ROS  negative genitourinary   Musculoskeletal negative musculoskeletal ROS (+)   Abdominal   Peds negative pediatric ROS (+)  Hematology negative hematology ROS (+)   Anesthesia Other Findings Glaucoma- Pt legally blind  Reproductive/Obstetrics negative OB ROS                        Anesthesia Physical Anesthesia Plan  ASA: III  Anesthesia Plan: General   Post-op Pain Management:    Induction: Intravenous  PONV Risk Score and Plan: 2 and Ondansetron, Dexamethasone and Treatment may vary due to age or medical condition  Airway Management Planned: LMA  Additional Equipment:   Intra-op Plan:   Post-operative Plan: Extubation in OR  Informed Consent: I have reviewed the patients History and Physical, chart, labs and discussed the procedure including the risks, benefits and alternatives for the proposed anesthesia with the patient or authorized representative who has indicated his/her  understanding and acceptance.   Dental advisory given  Plan Discussed with: CRNA  Anesthesia Plan Comments:         Anesthesia Quick Evaluation

## 2017-08-19 NOTE — Op Note (Signed)
Procedure: 1.  Cystoscopy with bladder biopsy and fulguration. 2.  Transrectal prostate ultrasound with ultrasound-guided prostate biopsy.  Preop diagnosis: 1.  Hematuria and bladder wall lesion. 2.  Prostate cancer with rising PSA.  Postop diagnosis: 1.  Bladder wall lesion within the anterior diverticulum. 2.  Prostate cancer with rising PSA.  Surgeon: Dr. [name].  Anesthesia: General.  Specimen: 1.  Bladder biopsy. 2.  13 prostate biopsy cores.  Drain: 16 [place]cill catheter.  EBL: Minimal.  Complications: None.  Indications: Stephen Stone is a 63 year old male who was diagnosed over a year ago with a Gleason 6 prostate cancer.  His PSA at that time was approximately 18 but now it is up to 25.  On CT there was questionable invasion into the right seminal vesicle but no obvious metastases.  Also there was a small hyperdense lesion in an anterior wall diverticulum.  He had microhematuria.  Procedure: He was given Rocephin and gentamicin for antibiotic prophylaxis.  He was taken to the operating room where general anesthetic was induced.  He was placed in the lithotomy position and fitted with PAS hose.  His perineum and genitalia were prepped with Betadine solution and he was draped in the usual sterile fashion.  Cystoscopy was performed using the 23 [place] scope and 30 and 70 degree lenses.  Inspection revealed a normal anterior urethra.  There was a small stricture in the bulb with a moderate caliber but upon passage of the scope and mucosal flap was raised which required placement of a sensor wire into the bladder to direct the cystoscope beneath the flap.  The external sphincter was intact.  The prostatic urethra was approximately 3 cm in length with trilobar hyperplasia but no other particular abnormalities.  Examination of the bladder demonstrated mild trabeculation with no mucosal lesions in the body of the bladder.  There was a small anterior wall diverticulum  approximately 1 cm in diameter that had some dystrophic calcification and erythema of the mucosa with some heaped up mucosa it was worrisome for neoplasm versus inflammation.  The ureteral orifice ease were unremarkable.  After completion of the inspection a cup biopsy was used to biopsy the lesion in the diverticulum.  A Bugbee electrode was then used to fulgurate the biopsy site and surrounding abnormal mucosa.  Once hemostasis was achieved the biopsy forceps was removed and a guidewire was replaced through the cystoscope into the bladder.  The cystoscope was removed and an 9 [place] [employer] Foley catheter was inserted over the wire to the bladder.  The balloon was filled with 10 cc of sterile fluid and the wire was removed.  The bladder was partially drained and the catheter was plugged.  I then performed transrectal ultrasonography of the prostate using a 10 MHz probe.  Inspection revealed minimal asymmetry of the seminal vesicles with the right more prominent than the left but no obvious cancer invasion.  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  The peripheral zone did not exhibit any hypoechoic lesions.  There were some central calcifications.  The prostate volume was 47 mL.  After completion of the diagnostic scan core biopsies were obtained with an initial biopsy from the right seminal vesicle area followed by the standard 12 core pattern biopsy.  There was minimal bleeding and no complications from the biopsy.  The probe was removed and the Foley  catheter was placed to straight drainage.  The patient was taken down from lithotomy position and his anesthetic was reversed.  He was moved to recovery room in stable condition. there were no complications.

## 2017-08-19 NOTE — Interval H&P Note (Signed)
History and Physical Interval Note:  His PSA is up to 25.5 at his last visit.    08/19/2017 11:06 AM  Stephen Stone  has presented today for surgery, with the diagnosis of PROSTATE CANCER HEMATURIA  The various methods of treatment have been discussed with the patient and family. After consideration of risks, benefits and other options for treatment, the patient has consented to  Procedure(s): BIOPSY TRANSRECTAL ULTRASONIC PROSTATE (TUBP) (N/A) CYSTOSCOPY (N/A) as a surgical intervention .  The patient's history has been reviewed, patient examined, no change in status, stable for surgery.  I have reviewed the patient's chart and labs.  Questions were answered to the patient's satisfaction.     [name]

## 2017-08-19 NOTE — Anesthesia Postprocedure Evaluation (Signed)
Anesthesia Post Note  Patient: Stephen Stone  Procedure(s) Performed: BIOPSY TRANSRECTAL ULTRASONIC PROSTATE (TUBP) (N/A ) CYSTOSCOPY (N/A )     Patient location during evaluation: PACU Anesthesia Type: General Level of consciousness: awake and alert Pain management: pain level controlled Vital Signs Assessment: post-procedure vital signs reviewed and stable Respiratory status: spontaneous breathing, nonlabored ventilation and respiratory function stable Cardiovascular status: blood pressure returned to baseline and stable Postop Assessment: no apparent nausea or vomiting Anesthetic complications: no    Last Vitals:  Vitals:   08/19/17 1400 08/19/17 1415  BP: 116/70   Pulse: 67 66  Resp: 17 14  Temp:    SpO2: 97% 95%    Last Pain:  Vitals:   08/19/17 0812  TempSrc: Oral                 Audry Pili

## 2017-08-19 NOTE — Anesthesia Procedure Notes (Signed)
Procedure Name: LMA Insertion Date/Time: 08/19/2017 1:08 PM Performed by: Wanita Chamberlain, CRNA Pre-anesthesia Checklist: Patient identified, Timeout performed, Emergency Drugs available, Suction available and Patient being monitored Patient Re-evaluated:Patient Re-evaluated prior to induction Oxygen Delivery Method: Circle system utilized Preoxygenation: Pre-oxygenation with 100% oxygen Induction Type: IV induction Ventilation: Mask ventilation without difficulty LMA: LMA inserted LMA Size: 5.0 Number of attempts: 1 Placement Confirmation: breath sounds checked- equal and bilateral and positive ETCO2 Tube secured with: Tape Dental Injury: Teeth and Oropharynx as per pre-operative assessment

## 2017-08-19 NOTE — Transfer of Care (Signed)
Immediate Anesthesia Transfer of Care Note  Patient: Stephen Stone  Procedure(s) Performed: BIOPSY TRANSRECTAL ULTRASONIC PROSTATE (TUBP) (N/A ) CYSTOSCOPY (N/A )  Patient Location: PACU  Anesthesia Type:General  Level of Consciousness: awake, alert , oriented and patient cooperative  Airway & Oxygen Therapy: Patient Spontanous Breathing and Patient connected to nasal cannula oxygen  Post-op Assessment: Report given to RN and Post -op Vital signs reviewed and stable  Post vital signs: Reviewed and stable  Last Vitals:  Vitals:   08/19/17 0812 08/19/17 1349  BP: 116/78   Pulse: 88 (P) 76  Resp: 19 (!) (P) 22  Temp: 36.8 C (P) 36.6 C  SpO2: 98% (P) 96%    Last Pain:  Vitals:   08/19/17 0812  TempSrc: Oral      Patients Stated Pain Goal: 6 (16/10/96 0454)  Complications: No apparent anesthesia complications

## 2017-08-19 NOTE — Discharge Instructions (Signed)
CYSTOSCOPY HOME CARE INSTRUCTIONS  Activity: Rest for the remainder of the day.  Do not drive or operate equipment today.  You may resume normal activities in one to two days as instructed by your physician.   Meals: Drink plenty of liquids and eat light foods such as gelatin or soup this evening.  You may return to a normal meal plan tomorrow.  Return to Work: You may return to work in one to two days or as instructed by your physician.  Special Instructions / Symptoms: Call your physician if any of these symptoms occur:   -persistent or heavy bleeding  -bleeding which continues after first few urination  -large blood clots that are difficult to pass  -urine stream diminishes or stops completely  -fever equal to or higher than 101 degrees Farenheit.  -cloudy urine with a strong, foul odor  -severe pain  You may remove the catheter in the morning by cutting off the side arm.  It should just slide out.   If you don't feel comfortable doing that, please call and come to the office for removal.    Post Anesthesia Home Care Instructions  Activity: Get plenty of rest for the remainder of the day. A responsible individual must stay with you for 24 hours following the procedure.  For the next 24 hours, DO NOT: -Drive a car -Paediatric nurse -Drink alcoholic beverages -Take any medication unless instructed by your physician -Make any legal decisions or sign important papers.  Meals: Start with liquid foods such as gelatin or soup. Progress to regular foods as tolerated. Avoid greasy, spicy, heavy foods. If nausea and/or vomiting occur, drink only clear liquids until the nausea and/or vomiting subsides. Call your physician if vomiting continues.  Special Instructions/Symptoms: Your throat may feel dry or sore from the anesthesia or the breathing tube placed in your throat during surgery. If this causes discomfort, gargle with warm salt water. The discomfort should disappear within 24  hours.

## 2017-08-20 ENCOUNTER — Encounter (HOSPITAL_BASED_OUTPATIENT_CLINIC_OR_DEPARTMENT_OTHER): Payer: Self-pay | Admitting: Urology

## 2017-08-21 ENCOUNTER — Telehealth (INDEPENDENT_AMBULATORY_CARE_PROVIDER_SITE_OTHER): Payer: Self-pay

## 2017-08-21 ENCOUNTER — Other Ambulatory Visit (INDEPENDENT_AMBULATORY_CARE_PROVIDER_SITE_OTHER): Payer: Self-pay | Admitting: Physician Assistant

## 2017-08-21 DIAGNOSIS — K047 Periapical abscess without sinus: Secondary | ICD-10-CM

## 2017-08-21 MED ORDER — AMOXICILLIN-POT CLAVULANATE 875-125 MG PO TABS: 1.0000 | ORAL_TABLET | Freq: Two times a day (BID) | ORAL | 0 refills | Status: DC

## 2017-08-21 NOTE — Telephone Encounter (Signed)
I have sent Augmentin to his pharmacy.  

## 2017-08-21 NOTE — Telephone Encounter (Signed)
Patient has had the oral surgery, patient states that the antibiotic he was prescribed is too strong and causing him to vomit so he stopped taking it. Patient would like a new abx that is not as strong as the current one. Nat Christen, CMA

## 2017-08-21 NOTE — Telephone Encounter (Signed)
Patient aware. Marielena Harvell S Tatia Petrucci, CMA  

## 2017-08-27 ENCOUNTER — Other Ambulatory Visit: Payer: Self-pay | Admitting: Urology

## 2017-08-27 DIAGNOSIS — C61 Malignant neoplasm of prostate: Secondary | ICD-10-CM

## 2017-08-28 ENCOUNTER — Encounter: Payer: Self-pay | Admitting: Radiation Oncology

## 2017-09-10 ENCOUNTER — Encounter (HOSPITAL_COMMUNITY)
Admission: RE | Admit: 2017-09-10 | Discharge: 2017-09-10 | Disposition: A | Discharge status: Home / Self Care | Payer: Medicare Other | Source: Ambulatory Visit | Attending: Urology | Admitting: Urology

## 2017-09-10 DIAGNOSIS — C61 Malignant neoplasm of prostate: Secondary | ICD-10-CM | POA: Diagnosis not present

## 2017-09-10 MED ORDER — TECHNETIUM TC 99M MEDRONATE IV KIT
25.0000 | PACK | Freq: Once | INTRAVENOUS | Status: AC | PRN
  Administered 2017-09-10: 20.7 via INTRAVENOUS

## 2017-09-17 ENCOUNTER — Ambulatory Visit (INDEPENDENT_AMBULATORY_CARE_PROVIDER_SITE_OTHER): Payer: Medicare Other | Admitting: Physician Assistant

## 2017-09-17 ENCOUNTER — Encounter (INDEPENDENT_AMBULATORY_CARE_PROVIDER_SITE_OTHER): Payer: Self-pay | Admitting: Physician Assistant

## 2017-09-17 VITALS — BP 111/76 | HR 73 | Temp 97.9°F | Resp 18 | Ht 72.0 in | Wt 226.0 lb

## 2017-09-17 DIAGNOSIS — Z1211 Encounter for screening for malignant neoplasm of colon: Secondary | ICD-10-CM | POA: Diagnosis not present

## 2017-09-17 DIAGNOSIS — E7841 Elevated Lipoprotein(a): Secondary | ICD-10-CM | POA: Diagnosis not present

## 2017-09-17 DIAGNOSIS — Z23 Encounter for immunization: Secondary | ICD-10-CM

## 2017-09-17 DIAGNOSIS — K0889 Other specified disorders of teeth and supporting structures: Secondary | ICD-10-CM | POA: Diagnosis not present

## 2017-09-17 MED ORDER — HYDROCODONE-ACETAMINOPHEN 5-325 MG PO TABS: 1.0000 | ORAL_TABLET | Freq: Every day | ORAL | 0 refills | Status: DC | PRN

## 2017-09-17 MED ORDER — AMOXICILLIN-POT CLAVULANATE 875-125 MG PO TABS: 1.0000 | ORAL_TABLET | Freq: Two times a day (BID) | ORAL | 0 refills | Status: DC

## 2017-09-17 NOTE — Progress Notes (Signed)
Subjective:  Patient ID: Stephen Stone, male    DOB: [birth year]  Age: 62 y.o. MRN: [id number]  CC: HLD  HPI Stephen Evanoffis a 37 y.o.malewith a medical history of blindness, CAD, PCI s/p stent 09/2102,Gleason6 prostate cancer, and tobacco abuse presents to f/u on HLD. Last lipid panel six months ago and was well controlled. Taking atorvastatin 80 mg. Does not complain of myalgias, CP, palpitations, dark urine, or back pain.     He is trying to work with dentistry to have all his teeth removed. In the meantime, he has chronic low level pain due to his extremely poor dentition. He also has occasional infections but denies having an infection currently. No current facial, neck, or oropharyngeal swelling. Anticipates having full dentures this year. Does not endorse any other symptoms or complaints.       Outpatient Medications Prior to Visit  Medication Sig Dispense Refill  . aspirin 325 MG EC tablet Take 1 tablet (325 mg total) by mouth daily. 90 tablet 30  . atorvastatin (LIPITOR) 80 MG tablet Take 1 tablet (80 mg total) by mouth every evening. 90 tablet 3  . lisinopril (PRINIVIL,ZESTRIL) 20 MG tablet Take 1 tablet (20 mg total) by mouth daily. 90 tablet 3  . metoprolol succinate (TOPROL-XL) 100 MG 24 hr tablet Take 1 tablet (100 mg total) by mouth daily. Take with or immediately following a meal. 90 tablet 3  . QUEtiapine (SEROQUEL) 200 MG tablet Take 1 tablet (200 mg total) by mouth at bedtime. 90 tablet 3  . amoxicillin-clavulanate (AUGMENTIN) 875-125 MG tablet Take 1 tablet by mouth 2 (two) times daily. 20 tablet 0  . doxycycline (VIBRA-TABS) 100 MG tablet Take 1 tablet (100 mg total) by mouth 2 (two) times daily. 20 tablet 0  . escitalopram (LEXAPRO) 20 MG tablet Take 1 tablet (20 mg total) by mouth daily. 90 tablet 3  . folic acid (FOLVITE) 354 MCG tablet Take 1 tablet (400 mcg total) by mouth daily. 90 tablet 3  . Misc Natural Products (PROSTATE HEALTH PO) Take 1 tablet by  mouth daily.    . traMADol (ULTRAM) 50 MG tablet Take 1 tablet (50 mg total) by mouth every 6 (six) hours as needed. 6 tablet 0   No facility-administered medications prior to visit.      ROS Review of Systems  Constitutional: Negative for chills, fever and malaise/fatigue.  HENT:       Dental pain  Eyes: Negative for blurred vision.  Respiratory: Negative for shortness of breath.   Cardiovascular: Negative for chest pain and palpitations.  Gastrointestinal: Negative for abdominal pain and nausea.  Genitourinary: Negative for dysuria and hematuria.  Musculoskeletal: Negative for joint pain and myalgias.  Skin: Negative for rash.  Neurological: Negative for tingling and headaches.  Psychiatric/Behavioral: Negative for depression. The patient is not nervous/anxious.     Objective:  BP 111/76 (BP Location: Left Arm, Patient Position: Sitting, Cuff Size: Normal)   Pulse 73   Temp 97.9 F (36.6 C) (Oral)   Resp 18   Ht 6' (1.829 m)   Wt 226 lb (102.5 kg)   SpO2 98%   BMI 30.65 kg/m   BP/Weight 09/17/2017 08/19/2017 6/56/8127  Systolic BP 517 001 749  Diastolic BP 76 78 84  Wt. (Lbs) 226 225.6 225  BMI 30.65 30.6 30.52      Physical Exam  Constitutional: He is oriented to person, place, and time.  Well developed, well nourished, NAD, polite  HENT:  Head: Normocephalic and  atraumatic.  Eyes: No scleral icterus.  blind  Neck: Normal range of motion. Neck supple. No thyromegaly present.  Cardiovascular: Normal rate, regular rhythm and normal heart sounds.  Pulmonary/Chest: Effort normal and breath sounds normal.  Musculoskeletal: He exhibits no edema.  Neurological: He is alert and oriented to person, place, and time. Coordination normal.  Skin: Skin is warm and dry. No rash noted. No erythema. No pallor.  Psychiatric: He has a normal mood and affect. His behavior is normal. Thought content normal.  Vitals reviewed.    Assessment & Plan:    1. Odontalgia -  amoxicillin-clavulanate (AUGMENTIN) 875-125 MG tablet; Take 1 tablet by mouth 2 (two) times daily.  Dispense: 20 tablet; Refill: 0 - HYDROcodone-acetaminophen (NORCO) 5-325 MG tablet; Take 1 tablet by mouth daily as needed for moderate pain.  Dispense: 30 tablet; Refill: 0  2. Elevated lipoprotein - Lipid panel  3. Need for Tdap vaccination - Tdap vaccine greater than or equal to 7yo IM  4. Special screening for malignant neoplasms, colon - Fecal occult blood, imunochemical   Meds ordered this encounter  Medications  . amoxicillin-clavulanate (AUGMENTIN) 875-125 MG tablet    Sig: Take 1 tablet by mouth 2 (two) times daily.    Dispense:  20 tablet    Refill:  0    Order Specific Question:   Supervising Provider    Answer:   Tresa Garter [id number]  . HYDROcodone-acetaminophen (NORCO) 5-325 MG tablet    Sig: Take 1 tablet by mouth daily as needed for moderate pain.    Dispense:  30 tablet    Refill:  0    Order Specific Question:   Supervising Provider    Answer:   Tresa Garter [id number]    Follow-up: Return if symptoms worsen or fail to improve.   [name] PA

## 2017-09-17 NOTE — Patient Instructions (Signed)
Dental Pain Dental pain may be caused by many things, including:  Tooth decay (cavities or caries). Cavities cause the nerve of your tooth to be open to air and hot or cold temperatures. This can cause pain or discomfort.  Abscess or infection. A dental abscess is an area that is full of infected pus from a bacterial infection in the inner part of the tooth (pulp). It usually happens at the end of the tooth's root.  Injury.  An unknown reason (idiopathic).  Your pain may be mild or severe. It may only happen when:  You are chewing.  You are exposed to hot or cold temperature.  You are eating or drinking sugary foods or beverages, such as: ? Soda. ? Candy.  Your pain may also be there all of the time. Follow these instructions at home: Watch your dental pain for any changes. Do these things to lessen your discomfort:  Take medicines only as told by your dentist.  If your dentist tells you to take an antibiotic medicine, finish all of it even if you start to feel better.  Keep all follow-up visits as told by your dentist. This is important.  Do not apply heat to the outside of your face.  Rinse your mouth or gargle with salt water if told by your dentist. This helps with pain and swelling. ? You can make salt water by adding  tsp of salt to 1 cup of warm water.  Apply ice to the painful area of your face: ? Put ice in a plastic bag. ? Place a towel between your skin and the bag. ? Leave the ice on for 20 minutes, 2-3 times per day.  Avoid foods or drinks that cause you pain, such as: ? Very hot or very cold foods or drinks. ? Sweet or sugary foods or drinks.  Contact a doctor if:  Your pain is not helped with medicines.  Your symptoms are worse.  You have new symptoms. Get help right away if:  You cannot open your mouth.  You are having trouble breathing or swallowing.  You have a fever.  Your face, neck, or jaw is puffy (swollen). This information is not  intended to replace advice given to you by your health care provider. Make sure you discuss any questions you have with your health care provider. Document Released: 01/01/2008 Document Revised: 12/21/2015 Document Reviewed: 07/11/2014 Elsevier Interactive Patient Education  2018 [employer]  

## 2017-09-18 LAB — LIPID PANEL
CHOLESTEROL TOTAL: 127 mg/dL (ref 100–199)
Chol/HDL Ratio: 4.1 ratio (ref 0.0–5.0)
HDL: 31 mg/dL — ABNORMAL LOW (ref >39)
LDL Calculated: 65 mg/dL (ref 0–99)
Triglycerides: 155 mg/dL — ABNORMAL HIGH (ref 0–149)
VLDL Cholesterol Cal: 31 mg/dL (ref 5–40)

## 2017-09-22 ENCOUNTER — Telehealth (INDEPENDENT_AMBULATORY_CARE_PROVIDER_SITE_OTHER): Payer: Self-pay | Admitting: Physician Assistant

## 2017-09-22 ENCOUNTER — Telehealth (INDEPENDENT_AMBULATORY_CARE_PROVIDER_SITE_OTHER): Payer: Self-pay | Admitting: *Deleted

## 2017-09-22 NOTE — Telephone Encounter (Signed)
-----   Message from [name], PA-C sent at 09/22/2017  8:42 AM EST ----- Bad cholesterol is well controlled. Triglycerides are slightly elevated. Stay on atorvastatin 80 mg and avoid excessive consumption of sweets.

## 2017-09-22 NOTE — Telephone Encounter (Signed)
Patient aware of his lab results °

## 2017-09-22 NOTE — Telephone Encounter (Signed)
Medical Assistant left message on patient's home and cell voicemail. Voicemail states to give a call back to [place] with [facility] at [phone]. !!!Please inform patient of bad cholesterol being controlled but triglycerides are slightly high. Patient should stay on atorvastatin 80 and avoid excessive sweets!!!!

## 2017-09-23 ENCOUNTER — Encounter: Payer: Self-pay | Admitting: Radiation Oncology

## 2017-09-23 NOTE — Progress Notes (Addendum)
GU Location of Tumor / Histology: prostatic adenocarcinoma  If Prostate Cancer, Gleason Score is (3 + 4) and PSA is (19) at diagnosis. PSA as of 08/06/2017 is 25.2. Prostate volume:47 cc.  Stephen Stone was diagnosed with Gleason 6 prostate cancer in [place] in late 2017. He moved here in April. Dr. Domenica Fail, PA referred patient to Dr. Jeffie Pollock for further evaluation of prostate cancer.  Patient has had two prostate biopsies.  1. Bladder, biopsy - REACTIVE UROTHELIAL AND SQUAMOUS LINED MUCOSA WITH ACUTE INFLAMMATION. - NO MALIGNANCY IDENTIFIED. 2. Prostate, needle biopsy(ies), right seminal vesicle - BENIGN SEMINAL VESICLE TISSUE. 3. Prostate, needle biopsy(ies), right base lateral - BENIGN PROSTATIC TISSUE. 4. Prostate, needle biopsy(ies), right base medial - BENIGN PROSTATIC TISSUE. 5. Prostate, needle biopsy(ies), right mid lateral - BENIGN PROSTATIC TISSUE. 6. Prostate, needle biopsy(ies), right mid medial - BENIGN PROSTATIC TISSUE. 7. Prostate, needle biopsy(ies), right apex lateral - BENIGN PROSTATIC TISSUE. 8. Prostate, needle biopsy(ies), right apex medial - BENIGN PROSTATIC TISSUE. 9. Prostate, needle biopsy(ies), left base lateral - PROSTATIC ADENOCARCINOMA, GLEASON SCORE 3+3=6. - CARCINOMA SPANS 8 MM OF ONE OF TWO CORES (50%). - PERINEURAL INVASION IS PRESENT. 10. Prostate, needle biopsy(ies), left base medial - BENIGN PROSTATIC TISSUE. 11. Prostate, needle biopsy(ies), left mid lateral - PROSTATIC ADENOCARCINOMA, GLEASON SCORE 3+4=7 (PATTERN 4 IS 20%), WHO GRADE GROUP 2. - CARCINOMA SPANS 4 MM OF ONE CORE (24%). 12. Prostate, needle biopsy(ies), left mid medial - BENIGN PROSTATIC TISSUE. 13. Prostate, needle biopsy(ies), left apex lateral 1 of 3 FINAL for [name], [name] ([id number]) Diagnosis(continued) - ATYPICAL SMALL ACINAR PROLIFERATION. 14. Prostate, needle biopsy(ies), left apex medial - BENIGN PROSTATIC TISSUE.  Past/Anticipated interventions by  urology, if any: prostate biopsy, ct abdomen/pelvis (negative), bone scan (negative), referral to radiation oncology (patient interested in seed implant)  Past/Anticipated interventions by medical oncology, if any: no  Weight changes, if any: no  Bowel/Bladder complaints, if any: IPSS 2.  Nocturia x 2. Denies dysuria, hematuria, leakage or incontinence.  Nausea/Vomiting, if any: no  Pain issues, if any:  no  SAFETY ISSUES:  Prior radiation? no  Pacemaker/ICD? no  Possible current pregnancy? no  Is the patient on methotrexate? no  Current Complaints / other details:  62 year old male. 3 sons and 4 daughter. Brother x2 with hx of prostate ca. Mother with hx of pancreatic cancer. Patient accompanied today by his daughter. Patient is legally blind in both eyes.

## 2017-09-24 ENCOUNTER — Other Ambulatory Visit: Payer: Self-pay

## 2017-09-24 ENCOUNTER — Ambulatory Visit
Admission: RE | Admit: 2017-09-24 | Discharge: 2017-09-24 | Disposition: A | Discharge status: Home / Self Care | Payer: Medicare Other | Source: Ambulatory Visit | Attending: Radiation Oncology | Admitting: Radiation Oncology

## 2017-09-24 ENCOUNTER — Encounter: Payer: Self-pay | Admitting: Radiation Oncology

## 2017-09-24 VITALS — Wt 226.8 lb

## 2017-09-24 DIAGNOSIS — I2510 Atherosclerotic heart disease of native coronary artery without angina pectoris: Secondary | ICD-10-CM | POA: Insufficient documentation

## 2017-09-24 DIAGNOSIS — Z800 Family history of malignant neoplasm of digestive organs: Secondary | ICD-10-CM | POA: Diagnosis not present

## 2017-09-24 DIAGNOSIS — I252 Old myocardial infarction: Secondary | ICD-10-CM | POA: Diagnosis not present

## 2017-09-24 DIAGNOSIS — Z7982 Long term (current) use of aspirin: Secondary | ICD-10-CM | POA: Insufficient documentation

## 2017-09-24 DIAGNOSIS — F17210 Nicotine dependence, cigarettes, uncomplicated: Secondary | ICD-10-CM | POA: Insufficient documentation

## 2017-09-24 DIAGNOSIS — Z79899 Other long term (current) drug therapy: Secondary | ICD-10-CM | POA: Diagnosis not present

## 2017-09-24 DIAGNOSIS — F419 Anxiety disorder, unspecified: Secondary | ICD-10-CM | POA: Diagnosis not present

## 2017-09-24 DIAGNOSIS — F329 Major depressive disorder, single episode, unspecified: Secondary | ICD-10-CM | POA: Diagnosis not present

## 2017-09-24 DIAGNOSIS — Z8042 Family history of malignant neoplasm of prostate: Secondary | ICD-10-CM | POA: Insufficient documentation

## 2017-09-24 DIAGNOSIS — C61 Malignant neoplasm of prostate: Secondary | ICD-10-CM | POA: Insufficient documentation

## 2017-09-24 DIAGNOSIS — E785 Hyperlipidemia, unspecified: Secondary | ICD-10-CM | POA: Diagnosis not present

## 2017-09-24 NOTE — Progress Notes (Signed)
See progress note under physician encounter. 

## 2017-09-24 NOTE — Progress Notes (Signed)
Radiation Oncology         [phone] ________________________________  Initial Outpatient Consultation  Name: Stephen Stone MRN: [id number]  Date: 09/24/2017  DOB: [birth year]  MB:TDHRC, Stephen Albee, PA-C  [name], MD   REFERRING PHYSICIAN: [name], MD  DIAGNOSIS: 62 y.o. gentleman with Stage T1c adenocarcinoma of the prostate with Gleason Score of 3+4, and PSA of 25.20    ICD-10-CM   1. Malignant neoplasm of prostate ([place]) C61   2. Prostate cancer ([place]) C61     HISTORY OF PRESENT ILLNESS: Stephen Stone is a 62 y.o. male with a diagnosis of prostate cancer. He was initially noted to have an elevated PSA of 19 and was diagnosed with Gleason 6 prostate cancer in [place] in November 2017. He planned to have a seed implant there but did not get it done. He moved here to [facility] in April 2018 and established care with Domenica Fail, PA-C who noted a PSA of 18.4 in August 2018. Accordingly, he was referred for evaluation in urology to Dr. Jeffie Pollock on 08/06/2017, where a digital rectal examination was performed at that time revealing firmness at the right base of the prostate without nodularity. His PSA was repeated on 08/06/17 and was increased to 25.20. Dr. Jeffie Pollock also noted microscopic hematuria on urinalysis. The patient proceeded to bladder biopsy and transrectal ultrasound with 12 biopsies of the prostate under anesthesia on 08/19/2017. The bladder biopsy just showed inflammation.  The prostate biopsy demonstrated a 47 cc prostate.  Out of 12 core biopsies, 2 were positive.  The maximum Gleason score was 3+4, and this was seen in the left mid lateral. There was also Gleason 3+3 disease in the left base lateral.  His staging scans included a CT abdomen/pelvis on 08/13/2017 and a bone scan on 09/10/2017 which both showed no evidence of metastatic disease. The patient reviewed these results with his urologist and he has kindly been referred today for discussion of potential radiation  treatment options. He is accompanied by his daughter.  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  PREVIOUS RADIATION THERAPY: No  PAST MEDICAL HISTORY:  Past Medical History:  Diagnosis Date  . Anxiety   . Coronary artery disease cardioloigst-  dr Stanford Breed--- previously cardiologist from [facility]   hx anterior wall STEMI,  10-05-2011  cardiac cath w/ PCI and BM stent x1 to distal LAD  . Depression   . 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 History of ST elevation myocardial infarction (STEMI) 10/05/2011    [place], [place]   acute anterior wall--- s/p  Cardiac cath w/ PCI and stent to distal LAD  . Hyperlipidemia   . 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 Prostate cancer ([place])   . S/p bare metal coronary artery stent 10-05-2011   [place], [place]   BM stent x1 to distal LAD      PAST SURGICAL HISTORY: Past Surgical History:  Procedure Laterality Date  . CARDIOVASCULAR STRESS TEST  04-23-2017   dr Stanford Breed   Low Risk Nuclear perfusion study w/ no evidence ischemia/  normal LV function and wall motion,  nuclear stress ef 58%  . CORONARY ANGIOPLASTY WITH STENT PLACEMENT  10-05-2011   [place], [place] (records scanned in epic)   in setting anterior STEMI-- PCI and BM (Vision) stent x1 to distal LAD normal LVEF 60% with  hypokinesis in the mid portion tf the anterolateral wall (30% mLAD,  30% CFx , 95% dLAD)  . CYSTOSCOPY N/A 08/19/2017   Procedure: CYSTOSCOPY;  Surgeon: [name], MD;  Location: [facility];  Service: Urology;  Laterality: N/A;  . EYE SURGERY    . PROSTATE BIOPSY N/A 08/19/2017   Procedure: BIOPSY TRANSRECTAL ULTRASONIC PROSTATE (TUBP);  Surgeon: [name], MD;  Location: [facility];  Service: Urology;  Laterality: N/A;  . TRANSTHORACIC ECHOCARDIOGRAM  03-21-2016    Dr Ronal Fear [facility])   mild concentric LVH,  ef 97-58%, grade 1 diastolic dysfunction/ mild PR/ trace TR    FAMILY HISTORY:  Family History  Problem Relation Age of Onset  . Cancer Mother        pancreatic  . CAD Father   . Cancer Brother        prostate  . Cancer Brother        prostate    SOCIAL HISTORY:  Social History   Socioeconomic History  . Marital status: Divorced    Spouse name: Not on file  . Number of children: 7  . Years of education: Not on file  . Highest education level: Not on file  Social Needs  . Financial resource strain: Not on file  . Food insecurity - worry: Not on file  . Food insecurity - inability: Not on file  . Transportation needs - medical: Not on file  . Transportation needs - non-medical: Not on file  Occupational History  . Not on file  Tobacco Use  . Smoking status: Current Some Day Smoker    Packs/day: 0.25    Years: 6.00    Pack years: 1.50    Types: Cigarettes  . Smokeless tobacco: Never Used  Substance and Sexual Activity  . Alcohol use: No  . Drug use: Yes    Types: Marijuana    Comment: can't remember last time  . Sexual activity: No  Other Topics Concern  . Not on file  Social History Narrative  . Not on file    ALLERGIES: Doxycycline  MEDICATIONS:  Current Outpatient Medications  Medication Sig Dispense Refill  . aspirin 325 MG EC tablet Take 1 tablet (325 mg total) by mouth daily. 90 tablet 30  . atorvastatin (LIPITOR) 80 MG tablet Take 1 tablet (80 mg total) by mouth every evening. 90 tablet 3  . lisinopril (PRINIVIL,ZESTRIL) 20 MG tablet Take 1 tablet (20 mg total) by mouth daily. 90 tablet 3  . metoprolol succinate (TOPROL-XL) 100 MG 24 hr tablet Take 1 tablet (100 mg total) by mouth daily. Take with or immediately following a meal. 90 tablet 3  . QUEtiapine (SEROQUEL) 200 MG tablet Take 1 tablet (200 mg total) by mouth at bedtime. 90 tablet 3  . HYDROcodone-acetaminophen (NORCO) 5-325 MG tablet Take 1 tablet by mouth daily as needed for moderate pain.  (Patient not taking: Reported on 09/24/2017) 30 tablet 0   No current facility-administered medications for this encounter.     REVIEW OF SYSTEMS:  On review of systems, the patient reports that he is doing well overall. He denies any chest pain, shortness of breath, cough, fevers, chills, night sweats, or unintended weight changes.  He does have a history of coronary artery disease requiring cardiac catheterization with stenting in 09/2011 in [place], [place].  He is followed locally with Dr. Stanford Breed and had a recent cardiac stress test in September 2018 which indicated low cardiovascular risk with no evidence  of ischemia, normal LV function and an EF of 58%.  He denies any bowel disturbances, and denies abdominal pain, nausea or vomiting. He denies any new musculoskeletal or joint aches or pains. His IPSS was 2, indicating mild urinary symptoms with nocturia x2. He denies any dysuria, hematuria, leakage or incontinence. He is not sexually active. A complete review of systems is obtained and is otherwise negative.    PHYSICAL EXAM:  Wt Readings from Last 3 Encounters:  09/24/17 226 lb 12.8 oz (102.9 kg)  09/17/17 226 lb (102.5 kg)  08/19/17 225 lb 9.6 oz (102.3 kg)   Temp Readings from Last 3 Encounters:  09/17/17 97.9 F (36.6 C) (Oral)  08/19/17 97.6 F (36.4 C) (Oral)  08/13/17 (!) 97.3 F (36.3 C) (Oral)   BP Readings from Last 3 Encounters:  09/17/17 111/76  08/19/17 132/78  08/13/17 121/84   Pulse Readings from Last 3 Encounters:  09/17/17 73  08/19/17 69  08/13/17 70    In general this is a well appearing African-American male in no acute distress. He is alert and oriented x4 and appropriate throughout the examination. HEENT reveals that the patient is normocephalic, atraumatic. He is legally blind in both eyes. Skin is intact without any evidence of gross lesions. Cardiovascular exam reveals a regular rate and rhythm, no clicks rubs or murmurs are auscultated. Chest is clear to  auscultation bilaterally. Lymphatic assessment is performed and does not reveal any adenopathy in the supraclavicular, axillary, or inguinal chains. Abdomen has active bowel sounds in all quadrants and is intact. The abdomen is soft, non tender, non distended. Lower extremities are negative for pretibial pitting edema, deep calf tenderness, cyanosis or clubbing.  KPS = 90  100 - Normal; no complaints; no evidence of disease. 90   - Able to carry on normal activity; minor signs or symptoms of disease. 80   - Normal activity with effort; some signs or symptoms of disease. 60   - Cares for self; unable to carry on normal activity or to do active work. 60   - Requires occasional assistance, but is able to care for most of his personal needs. 50   - Requires considerable assistance and frequent medical care. 25   - Disabled; requires special care and assistance. 78   - Severely disabled; hospital admission is indicated although death not imminent. 69   - Very sick; hospital admission necessary; active supportive treatment necessary. 10   - Moribund; fatal processes progressing rapidly. 0     - Dead  Karnofsky DA, Abelmann [place], Craver LS and Burchenal JH [phone]) The use of the nitrogen mustards in the palliative treatment of carcinoma: with particular reference to bronchogenic carcinoma Cancer 1 634-56  LABORATORY DATA:  Lab Results  Component Value Date   WBC 13.7 (H) 03/19/2017   HGB 13.9 08/19/2017   HCT 41.0 08/19/2017   MCV 91 03/19/2017   PLT 223 03/19/2017   Lab Results  Component Value Date   NA 141 08/19/2017   K 3.9 08/19/2017   CL 106 08/19/2017   CO2 20 03/19/2017   Lab Results  Component Value Date   ALT 24 03/19/2017   AST 16 03/19/2017   ALKPHOS 117 03/19/2017   BILITOT 0.2 03/19/2017     RADIOGRAPHY: Nm Bone Scan Whole Body  Result Date: 09/10/2017 CLINICAL DATA:  Prostate cancer, rising PSA = 25, will question osseous metastasis EXAM: NUCLEAR MEDICINE WHOLE BODY  BONE SCAN TECHNIQUE: Whole body anterior and posterior images were  obtained approximately 3 hours after intravenous injection of radiopharmaceutical. RADIOPHARMACEUTICALS:  20.7 mCi Technetium-42mMDP IV COMPARISON:  None Radiographic correlation: CT abdomen and pelvis 08/13/2017 FINDINGS: Minimal uptake at scattered joints typically degenerative. No abnormal osseous tracer accumulation identified to suggest osseous metastatic disease from prostate cancer. Expected urinary tract and soft tissue distribution of tracer. IMPRESSION: No scintigraphic evidence of osseous metastatic disease. Electronically Signed   By: MLavonia DanaM.D.   On: 09/10/2017 15:55      IMPRESSION/PLAN: 1. 62y.o. gentleman with Stage T1c adenocarcinoma of the prostate with Gleason Score of 3+4, and PSA of 25.20. We discussed the patient's workup and outlines the nature of prostate cancer in this setting. The patient's T stage, Gleason's score, and PSA put him into the high risk group but based on his recent biopsy and disease staging studies it is unclear as to whether the PSA is a true correlate. Accordingly, he is eligible for a variety of potential treatment options including brachytherapy, 8 weeks of external radiation or 5 weeks of external radiation followed by a brachytherapy boost. We discussed the available radiation techniques, and focused on the details and logistics and delivery. We discussed and outlined the risks, benefits, short and long-term effects associated with radiotherapy and compared and contrasted these with prostatectomy. We discussed the role of SpaceOAR in reducing the rectal toxicity associated with radiotherapy. We also detailed the role of ADT in the treatment of high-risk prostate cancer and outlined the associated side effects that could be expected with this therapy.  While ADT is certainly an option, it is not felt to be strongly indicated.  At the conclusion of our conversation, the patient is interested  in moving forward with brachytherapy with SpaceOAR to reduce rectal toxicity from radiotherapy.  We will share our discussion with Dr. WJeffie Pollockand move forward with [facility] in the near future. Because of his history of coronary artery disease, we will need to obtain clearance from his cardiologist before this procedure. The patient met briefly with SRomie Jumperin our office who will be working closely with him to coordinate OR scheduling and pre and post procedure appointments.  We will contact the pharmaceutical rep to ensure that SBridgetonis available at the time of procedure.  He will have a prostate MRI following his post-seed CT SIM to confirm appropriate distribution of the SCarmi   2. Possible genetic predisposition to malignancy. Given the patient's personal and family history of prostate cancer, he is a candidate for genetic testing.  We discussed the benefits of genetic testing but he is not interested in pursuing testing at this time.    ANicholos Johns PA-C    MTyler Pita MD  [facility] Direct Dial: [phone] Fax: [phone]  Skype  LinkedIn  This document serves as a record of services personally performed by MTyler Pita MD and AFreeman Caldron PA-C. It was created on their behalf by HRae Lips a trained medical scribe. The creation of this record is based on the scribe's personal observations and the providers' statements to them. This document has been checked and approved by the attending providers.

## 2017-09-26 ENCOUNTER — Telehealth: Payer: Self-pay | Admitting: Cardiology

## 2017-09-26 ENCOUNTER — Telehealth: Payer: Self-pay | Admitting: *Deleted

## 2017-09-26 NOTE — Telephone Encounter (Signed)
CALLED PATIENT'S DAUGHTER MONA TO INFORM OF PRE-SEED PLANNING CT FOR 11-21-17, LVM FOR A RETURN CALL

## 2017-09-26 NOTE — Telephone Encounter (Signed)
° °  [facility] Pre-operative Risk Assessment    Request for surgical clearance:  What type of surgery is being performed?  Prostate implant  1. When is this surgery scheduled? Waiting on clearance  2. What type of clearance is required (medical clearance vs. Pharmacy clearance to hold med vs. Both)? Both   3. Are there any medications that need to be held prior to surgery and how long? Up to cardiologist   Practice name and name of physician performing surgery?   Dr.Mathew Manning  4. What is your office phone and fax number? [phone]    Fax  [phone]  Anesthesia type (None, local, MAC, general) ? General   Stephen Stone 09/26/2017, 1:18 PM  _________________________________________________________________   (provider comments below)

## 2017-09-26 NOTE — Telephone Encounter (Signed)
   Primary Cardiologist: [name], MD  Chart reviewed as part of pre-operative protocol coverage. Patient was contacted 09/26/2017 in reference to pre-operative risk assessment for pending surgery as outlined below.  Stephen Stone was last seen on 04/23/2017 by Dr. Stanford Breed.  Since that day, Stephen Stone has done well without any chest pain or SOB. His myoview on 04/23/2017 was normal as well.   Therefore, based on ACC/AHA guidelines, the patient would be at acceptable risk for the planned procedure without further cardiovascular testing.   I will route this recommendation to the requesting party via Epic fax function and remove from pre-op pool.  Please call with questions. Ideally, patient will need to continue ASA during the procedure, please let know if it is absolutely necessary for the patient to come off the aspirin during the procedure. Note, although the clearance type is listed as both medical and pharmacy clearance. I do not see the patient on any systemic anticoagulation. No need for pharmacy clearance.   [place], [place] 09/26/2017, 7:12 PM

## 2017-09-29 ENCOUNTER — Telehealth: Payer: Self-pay | Admitting: Medical Oncology

## 2017-09-29 NOTE — Telephone Encounter (Signed)
Left a message to introduce myself as the prostate nurse navigator. I was unable to meet him 2/27 when he consulted with Dr. Tammi Klippel. He has chosen brachytherapy for treatment of his prostate cancer and is scheduled for CT simulation 11/21/17.

## 2017-10-03 ENCOUNTER — Other Ambulatory Visit: Payer: Self-pay | Admitting: Urology

## 2017-10-03 ENCOUNTER — Telehealth: Payer: Self-pay | Admitting: *Deleted

## 2017-10-03 NOTE — Telephone Encounter (Signed)
Called patient's daughter- Josph Macho to inform of appt. For cardiac clearance on March 27 @2 :30 pm with Dr. [name] @ the [facility]. And his pre-seed appts. For 11-21-17 and his implant on 12-25-17, lvm for a return call

## 2017-10-09 ENCOUNTER — Telehealth: Payer: Self-pay | Admitting: *Deleted

## 2017-10-09 NOTE — Telephone Encounter (Signed)
CALLED PATIENT TO INFORM OF PRE-SEED PLANNING CT AND HIS CHEST X-RAY AND EKG ON 11-21-17 AND HIS IMPLANT ON 11-25-17, SPOKE WITH PATIENT'S DAUGHTER MONA LISA AND SHE IS AWARE OF THESE APPTS.

## 2017-10-21 NOTE — Progress Notes (Signed)
Cardiology Office Note   Date:  10/22/2017   ID:  Stephen Stone, DOB Nov 29, 1955, MRN [id number]  PCP:  [name], PA-C  Cardiologist: Dr. Stanford Breed  Chief Complaint  Patient presents with  . Coronary Artery Disease  . Hyperlipidemia     History of Present Illness: Stephen Stone is a 62 y.o. male who presents for ongoing assessment and management of coronary artery disease, last seen by Dr. Stanford Breed on 04/14/2017.  Other history includes hyperlipidemia, stent to unknown coronary artery, placed while living in [place].  It was felt that his chest discomfort was related to GI etiology however a repeat stress nuclear study was ordered for risk stratification.  Study Highlights 04/23/2017    The left ventricular ejection fraction is normal (55-65%).  Nuclear stress EF: 58%.  There was no ST segment deviation noted during stress.  The study is normal.  This is a low risk study.   Normal resting and stress perfusion. No ischemia or infarction EF 58%    He is here today for preoperative clearance prostate biopsy and urostomy.  The patient is without complaints of chest pain dyspnea or fatigue.  Most recent nuclear medicine stress test as above negative for ischemia.   Past Medical History:  Diagnosis Date  . Anxiety   . Coronary artery disease cardioloigst-  dr Stanford Breed--- previously cardiologist from [facility]   hx anterior wall STEMI,  10-05-2011  cardiac cath w/ PCI and BM stent x1 to distal LAD  . Depression   . 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 History of ST elevation myocardial infarction (STEMI) 10/05/2011    [place], [place]   acute anterior wall--- s/p  Cardiac cath w/ PCI and stent to distal LAD  . Hyperlipidemia   . 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 Prostate cancer ([place])   . S/p bare metal coronary artery stent 10-05-2011   [place], [place]   BM stent  x1 to distal LAD    Past Surgical History:  Procedure Laterality Date  . CARDIOVASCULAR STRESS TEST  04-23-2017   dr Stanford Breed   Low Risk Nuclear perfusion study w/ no evidence ischemia/  normal LV function and wall motion,  nuclear stress ef 58%  . CORONARY ANGIOPLASTY WITH STENT PLACEMENT  10-05-2011   [place], [place] (records scanned in epic)   in setting anterior STEMI-- PCI and BM (Vision) stent x1 to distal LAD normal LVEF 60% with hypokinesis in the mid portion tf the anterolateral wall (30% mLAD,  30% CFx , 95% dLAD)  . CYSTOSCOPY N/A 08/19/2017   Procedure: CYSTOSCOPY;  Surgeon: [name], MD;  [facility];  Service: Urology;  Laterality: N/A;  . EYE SURGERY    . PROSTATE BIOPSY N/A 08/19/2017   Procedure: BIOPSY TRANSRECTAL ULTRASONIC PROSTATE (TUBP);  Surgeon: [name], MD;  Location: [facility];  Service: Urology;  Laterality: N/A;  . TRANSTHORACIC ECHOCARDIOGRAM  03-21-2016    Dr Ronal Fear [facility])   mild concentric LVH, ef 81-01%, grade 1 diastolic dysfunction/ mild PR/ trace TR     Current Outpatient Medications  Medication Sig Dispense Refill  . aspirin 325 MG EC tablet Take 1 tablet (325 mg total) by mouth daily. 90 tablet 30  . atorvastatin (LIPITOR) 80 MG tablet Take 1 tablet (80 mg total) by mouth every evening. 90 tablet 3  . lisinopril (  PRINIVIL,ZESTRIL) 20 MG tablet Take 1 tablet (20 mg total) by mouth daily. 90 tablet 3  . metoprolol succinate (TOPROL-XL) 100 MG 24 hr tablet Take 1 tablet (100 mg total) by mouth daily. Take with or immediately following a meal. 90 tablet 3  . QUEtiapine (SEROQUEL) 200 MG tablet Take 1 tablet (200 mg total) by mouth at bedtime. 90 tablet 3  . HYDROcodone-acetaminophen (NORCO/VICODIN) 5-325 MG tablet Take 1 tablet by mouth as needed for moderate pain. ONLY WITH PAIN WITH TEETH     No current facility-administered medications for this visit.     Allergies:   Doxycycline    Social  History:  The patient  reports that he has been smoking cigarettes.  He has a 1.50 pack-year smoking history. He has never used smokeless tobacco. He reports that he has current or past drug history. Drug: Marijuana. He reports that he does not drink alcohol.   Family History:  The patient's family history includes CAD in his father; Cancer in his brother, brother, and mother.    ROS: All other systems are reviewed and negative. Unless otherwise mentioned in H&P    PHYSICAL EXAM: VS:  BP 106/68 (BP Location: Left Arm, Patient Position: Sitting, Cuff Size: Normal)   Pulse 76   Ht 6' (1.829 m)   Wt 227 lb 6.4 oz (103.1 kg)   SpO2 98%   BMI 30.84 kg/m  , BMI Body mass index is 30.84 kg/m. GEN: Well nourished, well developed, in no acute distress  HEENT: Clouded right eye blind in the left eye Neck: no JVD, carotid bruits, or masses Cardiac: RRR; no murmurs, rubs, or gallops,no edema  Respiratory:  Cear to auscultation bilaterally, normal work of breathing GI: soft, nontender, nondistended, + BS MS: no deformity or atrophy  Skin: warm and dry, no rash Neuro:  Strength and sensation are intact Psych: euthymic mood, full affect   EKG: Normal sinus rhythm, minimal ST elevation in the inferior leads.  Nondiagnostic  Recent Labs: 03/07/2017: B Natriuretic Peptide 10.8; Magnesium 2.0 03/19/2017: ALT 24; Platelets 223 08/19/2017: BUN 12; Creatinine, Ser 0.90; Hemoglobin 13.9; Potassium 3.9; Sodium 141    Lipid Panel    Component Value Date/Time   CHOL 127 09/17/2017 1538   TRIG 155 (H) 09/17/2017 1538   HDL 31 (L) 09/17/2017 1538   CHOLHDL 4.1 09/17/2017 1538   LDLCALC 65 09/17/2017 1538      Wt Readings from Last 3 Encounters:  10/22/17 227 lb 6.4 oz (103.1 kg)  09/24/17 226 lb 12.8 oz (102.9 kg)  09/17/17 226 lb (102.5 kg)      Other studies Reviewed: Review of scanned documents of cardiac cath dated 10/07/2011 which revealed: Summary 1.Anterior ST elevation myocardial  infarction, status post successful primary percutaneous coronary intervention with bare mental stent (2.5 x 15 mm vision bare-metal stent).  Noteworthy, is the presence of a bridge at the site of the culprit lesion.  Also noteworthy, the fact that the patient reperfused by the time angiography was performed, and had to me-3 flow in the vessel prior to intervention.  2.  Normal left ventricular ejection fraction with hypokinesis in the mid portion of the anterior lateral wall  3.  Elevated LVEDP at 17 mmhg  ASSESSMENT AND PLAN:  1.  Coronary artery disease: History of stent to the LAD in 2013.  No other coronary artery disease as noted per cath.  Follow-up stress Myoview in 2018 was normal and low risk.  The patient is cleared from  a cardiac standpoint to proceed with urostomy and prostate biopsy scheduled in June 2019.   Chart reviewed as part of pre-operative protocol coverage. Given past medical history and time since last visit, based on ACC/AHA guidelines, Wylan Francois would be at acceptable risk for the planned procedure without further cardiovascular testing.  No medication should be discontinued or held perioperatively  2.  Hypercholesterolemia: Continue atorvastatin 80 mg daily.  His recent labs revealed very good control of his cholesterol.  3.  Hypertension: Blood pressure is well controlled on current medication regimen.  No changes.  Current medicines are reviewed at length with the patient today.    Labs/ tests ordered today include: Echocardiogram (patient request)  Phill Myron. [place] Pugh, ANP, AACC   10/22/2017 2:58 PM    [facility] [address] [place], [place] [address] Phone: [phone]; Fax: [phone]

## 2017-10-22 ENCOUNTER — Ambulatory Visit (INDEPENDENT_AMBULATORY_CARE_PROVIDER_SITE_OTHER): Payer: Medicare Other | Admitting: Adult Health

## 2017-10-22 ENCOUNTER — Encounter: Payer: Self-pay | Admitting: Adult Health

## 2017-10-22 VITALS — BP 106/68 | HR 76 | Ht 72.0 in | Wt 227.4 lb

## 2017-10-22 DIAGNOSIS — I10 Essential (primary) hypertension: Secondary | ICD-10-CM

## 2017-10-22 DIAGNOSIS — E7800 Pure hypercholesterolemia, unspecified: Secondary | ICD-10-CM | POA: Diagnosis not present

## 2017-10-22 DIAGNOSIS — I2510 Atherosclerotic heart disease of native coronary artery without angina pectoris: Secondary | ICD-10-CM | POA: Diagnosis not present

## 2017-10-22 DIAGNOSIS — I5189 Other ill-defined heart diseases: Secondary | ICD-10-CM

## 2017-10-22 DIAGNOSIS — I519 Heart disease, unspecified: Secondary | ICD-10-CM

## 2017-10-22 NOTE — Patient Instructions (Signed)
Medication Instructions:  NO CHANGES- Your physician recommends that you continue on your current medications as directed. Please refer to the Current Medication list given to you today.  If you need a refill on your cardiac medications before your next appointment, please call your pharmacy.  Special Instructions: Cleared for surgery  Echocardiogram - Your physician has requested that you have an echocardiogram. Echocardiography is a painless test that uses sound waves to create images of your heart. It provides your doctor with information about the size and shape of your heart and how well your heart's chambers and valves are working. This procedure takes approximately one hour. There are no restrictions for this procedure. This will be performed at our [facility] location - [address].   Follow-Up: Your physician wants you to follow-up in: [place]. You should receive a reminder letter in the mail two months in advance. If you do not receive a letter, please call our office 02-2018 to schedule the 04-2018 follow-up appointment.   Thank you for choosing [facility] at [facility]!!

## 2017-11-21 ENCOUNTER — Ambulatory Visit
Admission: RE | Admit: 2017-11-21 | Discharge: 2017-11-21 | Disposition: A | Discharge status: Home / Self Care | Payer: Medicare Other | Source: Ambulatory Visit | Attending: Radiation Oncology | Admitting: Radiation Oncology

## 2017-11-21 ENCOUNTER — Ambulatory Visit (HOSPITAL_COMMUNITY)
Admission: RE | Admit: 2017-11-21 | Discharge: 2017-11-21 | Disposition: A | Discharge status: Home / Self Care | Payer: Medicare Other | Source: Ambulatory Visit | Attending: Urology | Admitting: Urology

## 2017-11-21 ENCOUNTER — Encounter: Payer: Self-pay | Admitting: Medical Oncology

## 2017-11-21 ENCOUNTER — Encounter (HOSPITAL_COMMUNITY)
Admission: RE | Admit: 2017-11-21 | Discharge: 2017-11-21 | Disposition: A | Discharge status: Home / Self Care | Payer: Medicare Other | Source: Ambulatory Visit | Attending: Urology | Admitting: Urology

## 2017-11-21 DIAGNOSIS — C61 Malignant neoplasm of prostate: Secondary | ICD-10-CM | POA: Diagnosis not present

## 2017-11-21 DIAGNOSIS — Z510 Encounter for antineoplastic radiation therapy: Secondary | ICD-10-CM | POA: Insufficient documentation

## 2017-11-21 NOTE — Progress Notes (Signed)
Met Stephen Stone and his daughter and introduced myself as the prostate nurse navigator. I was unable to meet him the day he consulted with Dr. Tammi Klippel. I did reach out by phone but he did not return my call. He states he is doing well and ready to move forward with seed implant. We discussed the procedure and side effects. He has a strong family history of prostate and pancreatic cancer. He is considering doing genetic testing. He is scheduled for seed implant 12/25/17. I asked him to call with questions or concerns.

## 2017-11-21 NOTE — Progress Notes (Signed)
  Radiation Oncology         [phone]) [phone] ________________________________  Name: Stephen Stone MRN: [id number]  Date: 11/21/2017  DOB: [birth year]  SIMULATION AND TREATMENT PLANNING NOTE PUBIC ARCH STUDY  AT:[facility], Lesli Albee, Ladell Pier, MD  DIAGNOSIS: 62 y.o. gentleman with Stage T1c adenocarcinoma of the prostate with Gleason Score of 3+4, and PSA of 25.20     ICD-10-CM   1. Prostate cancer ([place]) C61     COMPLEX SIMULATION:  The patient presented today for evaluation for possible prostate seed implant. He was brought to the radiation planning suite and placed supine on the CT couch. A 3-dimensional image study set was obtained in upload to the planning computer. There, on each axial slice, I contoured the prostate gland. Then, using three-dimensional radiation planning tools I reconstructed the prostate in view of the structures from the transperineal needle pathway to assess for possible pubic arch interference. In doing so, I did not appreciate any pubic arch interference. Also, the patient's prostate volume was estimated based on the drawn structure. The volume was 60 cc.  Given the pubic arch appearance and prostate volume, patient remains a good candidate to proceed with prostate seed implant. Today, he freely provided informed written consent to proceed.    PLAN: The patient will undergo boost prostate seed implant to 110 Gy followed by external radiation.   ________________________________  Sheral Apley Tammi Klippel, M.D.  This document serves as a record of services personally performed by Tyler Pita, MD. It was created on his behalf by Arlyce Harman, a trained medical scribe. The creation of this record is based on the scribe's personal observations and the provider's statements to them. This document has been checked and approved by the attending provider.

## 2017-12-10 ENCOUNTER — Telehealth: Payer: Self-pay | Admitting: Cardiology

## 2017-12-10 NOTE — Telephone Encounter (Signed)
New Message:          [facility] Pre-operative Risk Assessment    Request for surgical clearance:  1. What type of surgery is being performed? Radioactive seed implants   2. When is this surgery scheduled? 12/25/17   3. What type of clearance is required (medical clearance vs. Pharmacy clearance to hold med vs. Both)? Pharmacy  4. Are there any medications that need to be held prior to surgery and how long?aspirin 325 MG EC tablet for 5 prior to procedure  5. Practice name and name of physician performing surgery? [facility]   6. What is your office phone number [phone]    7.   What is your office fax number [phone]  8.   Anesthesia type (None, local, MAC, general) ? General   Maricela Bo Pulliam 12/10/2017, 11:52 AM  _________________________________________________________________   (provider comments below)

## 2017-12-11 ENCOUNTER — Other Ambulatory Visit: Payer: Self-pay | Admitting: Urology

## 2017-12-11 DIAGNOSIS — C61 Malignant neoplasm of prostate: Secondary | ICD-10-CM

## 2017-12-11 NOTE — Telephone Encounter (Signed)
Routing to Dr. Stanford Breed to see if ok to hold ASA 5 days prior to procedure.

## 2017-12-11 NOTE — Telephone Encounter (Signed)
   Primary Cardiologist: [name], MD  Chart reviewed as part of pre-operative protocol coverage. Patient was contacted 12/11/2017 in reference to pre-operative risk assessment for pending surgery as outlined below.  Stephen Stone was last seen on 10/22/17 by Jory Sims NP.  Since that day, Stephen Stone has done well with no new complaints. He can complete more than 4.0 METS.  Per Dr. Stanford Breed, prefer to continue ASA 81 mg, but can hold for 5 days if absolutely necessary.  Therefore, based on ACC/AHA guidelines, the patient would be at acceptable risk for the planned procedure without further cardiovascular testing.   I will route this recommendation to the requesting party via Epic fax function and remove from pre-op pool.  Please call with questions.  Tami Lin Nithya Meriweather, PA 12/11/2017, 4:31 PM

## 2017-12-11 NOTE — Telephone Encounter (Signed)
Would prefer to continue 81 mg daily if possible but can hold for 5 days if absolutely necessary [name]

## 2017-12-17 ENCOUNTER — Telehealth: Payer: Self-pay | Admitting: *Deleted

## 2017-12-17 NOTE — Telephone Encounter (Signed)
Called patient to remind of lab appt. for 12-18-17 - arrival time - 12:45 pm @ [facility] Admitting, lvm for return call for his daughter - Stephen Stone

## 2017-12-18 ENCOUNTER — Other Ambulatory Visit (HOSPITAL_COMMUNITY): Payer: Medicare Other

## 2017-12-18 ENCOUNTER — Encounter (HOSPITAL_COMMUNITY)
Admission: RE | Admit: 2017-12-18 | Discharge: 2017-12-18 | Disposition: A | Discharge status: Home / Self Care | Payer: Medicare Other | Source: Ambulatory Visit | Attending: Urology | Admitting: Urology

## 2017-12-18 DIAGNOSIS — Z8249 Family history of ischemic heart disease and other diseases of the circulatory system: Secondary | ICD-10-CM | POA: Diagnosis not present

## 2017-12-18 DIAGNOSIS — Z8042 Family history of malignant neoplasm of prostate: Secondary | ICD-10-CM | POA: Diagnosis not present

## 2017-12-18 DIAGNOSIS — I252 Old myocardial infarction: Secondary | ICD-10-CM | POA: Insufficient documentation

## 2017-12-18 DIAGNOSIS — H409 Unspecified glaucoma: Secondary | ICD-10-CM | POA: Insufficient documentation

## 2017-12-18 DIAGNOSIS — Z7982 Long term (current) use of aspirin: Secondary | ICD-10-CM | POA: Diagnosis not present

## 2017-12-18 DIAGNOSIS — C61 Malignant neoplasm of prostate: Secondary | ICD-10-CM | POA: Diagnosis not present

## 2017-12-18 DIAGNOSIS — Z79899 Other long term (current) drug therapy: Secondary | ICD-10-CM | POA: Diagnosis not present

## 2017-12-18 DIAGNOSIS — Z01812 Encounter for preprocedural laboratory examination: Secondary | ICD-10-CM | POA: Diagnosis present

## 2017-12-18 LAB — CBC
HCT: 42.9 % (ref 39.0–52.0)
Hemoglobin: 14.6 g/dL (ref 13.0–17.0)
MCH: 30.8 pg (ref 26.0–34.0)
MCHC: 34 g/dL (ref 30.0–36.0)
MCV: 90.5 fL (ref 78.0–100.0)
PLATELETS: 204 10*3/uL (ref 150–400)
RBC: 4.74 MIL/uL (ref 4.22–5.81)
RDW: 13.5 % (ref 11.5–15.5)
WBC: 10.6 10*3/uL — ABNORMAL HIGH (ref 4.0–10.5)

## 2017-12-18 LAB — COMPREHENSIVE METABOLIC PANEL
ALT: 28 U/L (ref 17–63)
AST: 21 U/L (ref 15–41)
Albumin: 3.6 g/dL (ref 3.5–5.0)
Alkaline Phosphatase: 139 U/L — ABNORMAL HIGH (ref 38–126)
Anion gap: 7 (ref 5–15)
BUN: 14 mg/dL (ref 6–20)
CHLORIDE: 110 mmol/L (ref 101–111)
CO2: 22 mmol/L (ref 22–32)
Calcium: 9 mg/dL (ref 8.9–10.3)
Creatinine, Ser: 0.97 mg/dL (ref 0.61–1.24)
Glucose, Bld: 135 mg/dL — ABNORMAL HIGH (ref 65–99)
POTASSIUM: 4.2 mmol/L (ref 3.5–5.1)
Sodium: 139 mmol/L (ref 135–145)
Total Bilirubin: 0.3 mg/dL (ref 0.3–1.2)
Total Protein: 7.3 g/dL (ref 6.5–8.1)

## 2017-12-18 LAB — APTT: aPTT: 29 seconds (ref 24–36)

## 2017-12-18 LAB — PROTIME-INR
INR: 1.03
PROTHROMBIN TIME: 13.4 s (ref 11.4–15.2)

## 2017-12-23 ENCOUNTER — Encounter (HOSPITAL_BASED_OUTPATIENT_CLINIC_OR_DEPARTMENT_OTHER): Payer: Self-pay | Admitting: *Deleted

## 2017-12-23 NOTE — Progress Notes (Signed)
SPOKE W/ PT VIA PHONE FOR PRE-OP INTERVIEW.  NPO AFTER MN.  ARRIVE AT 0730.  CURRENT LAB RESULTS, DATED 12-18-2017, CXR AND EKG IN CHART AND Epic.  WILL TAKE TOPROL AM DOS W/ SIPS OF WATER AND WILL DO FLEET ENEMA.

## 2017-12-24 ENCOUNTER — Telehealth: Payer: Self-pay | Admitting: *Deleted

## 2017-12-24 NOTE — Telephone Encounter (Signed)
Called patient's daughter- Josph Macho to remind of dad's implant for 12-25-17, lvm for a return call

## 2017-12-24 NOTE — H&P (Signed)
CC: Problems/Symptoms  HPI: Stephen Stone is a 62 year-old male established patient who is here related to problem/symptoms noted below.  He denies any voiced complaints at this time. No recent CP, SOB, swelling, f/c, or n/v.    His presenting problem is Seen today for a preop visit. Scheduled for Brachytherapy with SpaceOAR implant 12/25/17. Marland Kitchen     ALLERGIES: None   MEDICATIONS: Lisinopril 20 mg tablet  Metoprolol Succinate 100 mg tablet, extended release 24 hr  Adult Aspirin 81 mg tablet, delayed release  Atorvastatin Calcium 1 tablet PO Daily  Hydrocodone-Acetaminophen 5 mg-325 mg tablet PO PRN  Seroquel     GU PSH: Cysto Bladder Ureth Biopsy - 08/19/2017 Locm 300-399Mg /Ml Iodine,1Ml - 08/13/2017 Prostate Needle Biopsy - 08/19/2017    NON-GU PSH: Cardiac Stent Placement - about 2016 Eye Surgery (Unspecified) - about 1999    GU PMH: Elevated PSA - 08/06/2017 Microscopic hematuria, He has 3-10 RBC's on UA today. I am going to get a BMP and set him up for a CT hematuria study. I will do cystoscopy at the time of the prostate biopsy and reviewed the risks of the procedure. - 08/06/2017 Prostate Cancer, He has a history of Gleason 6 prostate cancer. By report his PSA has been stable. He has some induration of the base of the prostate but is voiding well. I am going to get records from his prior Urologist in [place] and will obtain a PSA today. Since it has been over a year since his biopsy, I will get him set up for a repeat biopsy and as an outpatient. he declines in office biopsy. I reviewed the risks of bleeding, infection and voiding difficulty. He was given Levaquin for the prep. - 08/06/2017 Prostate nodule w/o LUTS - 08/06/2017    NON-GU PMH: Anxiety Glaucoma Myocardial Infarction    FAMILY HISTORY: 3 Son's - Son 4 daughters - Daughter Acute Myocardial Infarction - Father Death of family member - Mother, Father Prostate Cancer - Brother   SOCIAL HISTORY: Marital Status:  Single Preferred Language: English; Ethnicity: Not Hispanic Or Latino; Race: Black or African American Current Smoking Status: Patient smokes. Has smoked since 11/27/2011. Smokes less than 1/2 pack per day.   Tobacco Use Assessment Completed: Used Tobacco in last 30 days? Does not use smokeless tobacco. Has never drank.  Does not use drugs. Drinks 1 caffeinated drink per day. Has not had a blood transfusion. Patient's occupation is/was disabled.    REVIEW OF SYSTEMS:    GU Review Male:   Patient denies frequent urination, hard to postpone urination, burning/ pain with urination, get up at night to urinate, leakage of urine, stream starts and stops, trouble starting your stream, have to strain to urinate , erection problems, and penile pain.  Gastrointestinal (Upper):   Patient denies nausea, vomiting, and indigestion/ heartburn.  Gastrointestinal (Lower):   Patient denies diarrhea and constipation.  Constitutional:   Patient denies fever, night sweats, weight loss, and fatigue.  Skin:   Patient denies skin rash/ lesion and itching.  Eyes:   Patient denies blurred vision and double vision.  Ears/ Nose/ Throat:   Patient denies sore throat and sinus problems.  Hematologic/Lymphatic:   Patient denies swollen glands and easy bruising.  Cardiovascular:   Patient denies leg swelling and chest pains.  Respiratory:   Patient denies shortness of breath and cough.  Endocrine:   Patient denies excessive thirst.  Musculoskeletal:   Patient denies back pain and joint pain.  Neurological:  Patient denies headaches and dizziness.  Psychologic:   Patient denies depression and anxiety.   VITAL SIGNS:      12/09/2017 01:24 PM  BP 109/73 mmHg  Pulse 67 /min  Temperature 97.7 F / 36.5 C   MULTI-SYSTEM PHYSICAL EXAMINATION:    Constitutional: Well-nourished. No physical deformities. Normally developed. Good grooming.   Neck: Neck symmetrical, not swollen. Normal tracheal position.   Respiratory:  Normal breath sounds. No labored breathing, no use of accessory muscles.   Cardiovascular: Regular rate and rhythm. No murmur, no gallop. Normal temperature, normal extremity pulses, no swelling, no varicosities.   Skin: No paleness, no jaundice, no cyanosis. No lesion, no ulcer, no rash.   Neurologic / Psychiatric: Oriented to time, oriented to place, oriented to person. No depression, no anxiety, no agitation.   Gastrointestinal: No mass, no tenderness, no rigidity, non obese abdomen.   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 Right ear no scars, no lesions, no masses. Nose no scars, no lesions, no masses. Normal hearing. Normal lips.   Musculoskeletal: Normal gait and station of head and neck. 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      PAST DATA REVIEWED:  Source Of History:  Patient   08/06/17 03/20/17  PSA  Total PSA 25.20 ng/mL 18.4 ng/dl    PROCEDURES:          Urinalysis - 81003 Dipstick Dipstick Cont'd Micro  Specimen: Voided Bilirubin: Neg WBC/hpf: 0 - 5/hpf  Color: Yellow Ketones: Neg RBC/hpf: NS (Not Seen)  Appearance: Clear Blood: Neg Bacteria: NS (Not Seen)  Specific Gravity: 1.025 Protein: 1+ Cystals: NS (Not Seen)  pH: 5.5 Urobilinogen: 0.2 Casts: NS (Not Seen)  Glucose: Neg Nitrites: Neg Trichomonas: Not Present    Leukocyte Esterase: Neg Mucous: Present      Epithelial Cells: 0 - 5/hpf      Yeast: NS (Not Seen)      Sperm: Not Present    ASSESSMENT:      ICD-10 Details  1 GU:   Prostate Cancer - C61 Stable, Chronic - Cleared to proceed with upcoming surgical procedure. Culture urine. No ABX unless culture proven UTI.    PLAN:            Medications Stop Meds: Lexapro 20 mg tablet  Discontinue: 12/09/2017  - Reason: The medication cycle was completed.            Orders Labs Urine Culture          Schedule Return Visit/Planned Activity: Keep Scheduled Appointment          Document Letter(s):  Created for  Patient: Clinical Summary   Created for [name], MD   Created for Domenica Fail, PA         Next Appointment:      Next Appointment: 12/25/2017 09:30 AM    Appointment Type: Surgery     Location: [facility] [phone]    Provider: [name], M.D.    Reason for Visit: OP [place]

## 2017-12-25 ENCOUNTER — Encounter (HOSPITAL_BASED_OUTPATIENT_CLINIC_OR_DEPARTMENT_OTHER): Payer: Self-pay

## 2017-12-25 ENCOUNTER — Ambulatory Visit (HOSPITAL_BASED_OUTPATIENT_CLINIC_OR_DEPARTMENT_OTHER): Payer: Medicare Other | Admitting: Certified Registered Nurse Anesthetist

## 2017-12-25 ENCOUNTER — Ambulatory Visit (HOSPITAL_COMMUNITY): Payer: Medicare Other

## 2017-12-25 ENCOUNTER — Ambulatory Visit (HOSPITAL_BASED_OUTPATIENT_CLINIC_OR_DEPARTMENT_OTHER)
Admission: RE | Admit: 2017-12-25 | Discharge: 2017-12-25 | Disposition: A | Discharge status: Home / Self Care | Payer: Medicare Other | Source: Ambulatory Visit | Attending: Urology | Admitting: Urology

## 2017-12-25 ENCOUNTER — Other Ambulatory Visit: Payer: Self-pay

## 2017-12-25 ENCOUNTER — Encounter (HOSPITAL_BASED_OUTPATIENT_CLINIC_OR_DEPARTMENT_OTHER)
Admission: RE | Disposition: A | Discharge status: Home / Self Care | Payer: Self-pay | Source: Ambulatory Visit | Attending: Urology

## 2017-12-25 DIAGNOSIS — F419 Anxiety disorder, unspecified: Secondary | ICD-10-CM | POA: Insufficient documentation

## 2017-12-25 DIAGNOSIS — Z79899 Other long term (current) drug therapy: Secondary | ICD-10-CM | POA: Insufficient documentation

## 2017-12-25 DIAGNOSIS — F17210 Nicotine dependence, cigarettes, uncomplicated: Secondary | ICD-10-CM | POA: Insufficient documentation

## 2017-12-25 DIAGNOSIS — C61 Malignant neoplasm of prostate: Secondary | ICD-10-CM | POA: Diagnosis present

## 2017-12-25 DIAGNOSIS — I252 Old myocardial infarction: Secondary | ICD-10-CM | POA: Insufficient documentation

## 2017-12-25 HISTORY — PX: SPACE OAR INSTILLATION: SHX6769

## 2017-12-25 HISTORY — PX: CYSTOSCOPY: SHX5120

## 2017-12-25 HISTORY — PX: RADIOACTIVE SEED IMPLANT: SHX5150

## 2017-12-25 SURGERY — INSERTION, RADIATION SOURCE, PROSTATE
Anesthesia: General

## 2017-12-25 MED ORDER — SODIUM CHLORIDE 0.9% FLUSH
3.0000 mL | INTRAVENOUS | Status: DC | PRN
  Filled 2017-12-25: qty 3

## 2017-12-25 MED ORDER — CIPROFLOXACIN IN D5W 400 MG/200ML IV SOLN
INTRAVENOUS | Status: AC
  Filled 2017-12-25: qty 200

## 2017-12-25 MED ORDER — FENTANYL CITRATE (PF) 100 MCG/2ML IJ SOLN
25.0000 ug | INTRAMUSCULAR | Status: DC | PRN
  Filled 2017-12-25: qty 1

## 2017-12-25 MED ORDER — EPHEDRINE SULFATE 50 MG/ML IJ SOLN
INTRAMUSCULAR | Status: AC
  Filled 2017-12-25: qty 1

## 2017-12-25 MED ORDER — KETOROLAC TROMETHAMINE 30 MG/ML IJ SOLN
30.0000 mg | Freq: Once | INTRAMUSCULAR | Status: DC | PRN
  Filled 2017-12-25: qty 1

## 2017-12-25 MED ORDER — DEXAMETHASONE SODIUM PHOSPHATE 10 MG/ML IJ SOLN
INTRAMUSCULAR | Status: DC | PRN
  Administered 2017-12-25: 10 mg via INTRAVENOUS

## 2017-12-25 MED ORDER — DEXAMETHASONE SODIUM PHOSPHATE 10 MG/ML IJ SOLN
INTRAMUSCULAR | Status: AC
  Filled 2017-12-25: qty 1

## 2017-12-25 MED ORDER — FLEET ENEMA 7-19 GM/118ML RE ENEM
1.0000 | ENEMA | Freq: Once | RECTAL | Status: AC
  Administered 2017-12-25: 1 via RECTAL
  Filled 2017-12-25: qty 1

## 2017-12-25 MED ORDER — TRAMADOL HCL 50 MG PO TABS: 50.0000 mg | ORAL_TABLET | Freq: Four times a day (QID) | ORAL | 0 refills | Status: DC | PRN

## 2017-12-25 MED ORDER — LIDOCAINE 2% (20 MG/ML) 5 ML SYRINGE
INTRAMUSCULAR | Status: DC | PRN
  Administered 2017-12-25: 60 mg via INTRAVENOUS

## 2017-12-25 MED ORDER — OXYCODONE HCL 5 MG PO TABS
5.0000 mg | ORAL_TABLET | ORAL | Status: DC | PRN
  Filled 2017-12-25: qty 2

## 2017-12-25 MED ORDER — SODIUM CHLORIDE 0.9 % IV SOLN
250.0000 mL | INTRAVENOUS | Status: DC | PRN
  Filled 2017-12-25: qty 250

## 2017-12-25 MED ORDER — ACETAMINOPHEN 650 MG RE SUPP
650.0000 mg | RECTAL | Status: DC | PRN
  Filled 2017-12-25: qty 1

## 2017-12-25 MED ORDER — ONDANSETRON HCL 4 MG/2ML IJ SOLN
INTRAMUSCULAR | Status: DC | PRN
Start: 2017-12-25 — End: 2017-12-25
  Administered 2017-12-25: 4 mg via INTRAVENOUS

## 2017-12-25 MED ORDER — PROPOFOL 500 MG/50ML IV EMUL
INTRAVENOUS | Status: AC
  Filled 2017-12-25: qty 50

## 2017-12-25 MED ORDER — PHENYLEPHRINE 40 MCG/ML (10ML) SYRINGE FOR IV PUSH (FOR BLOOD PRESSURE SUPPORT)
PREFILLED_SYRINGE | INTRAVENOUS | Status: AC
  Filled 2017-12-25: qty 10

## 2017-12-25 MED ORDER — ACETAMINOPHEN 325 MG PO TABS
650.0000 mg | ORAL_TABLET | ORAL | Status: DC | PRN
  Filled 2017-12-25: qty 2

## 2017-12-25 MED ORDER — LACTATED RINGERS IV SOLN
INTRAVENOUS | Status: DC
  Administered 2017-12-25 (×2): via INTRAVENOUS
  Filled 2017-12-25: qty 1000

## 2017-12-25 MED ORDER — FENTANYL CITRATE (PF) 100 MCG/2ML IJ SOLN
INTRAMUSCULAR | Status: AC
  Filled 2017-12-25: qty 2

## 2017-12-25 MED ORDER — PROMETHAZINE HCL 25 MG/ML IJ SOLN
6.2500 mg | INTRAMUSCULAR | Status: DC | PRN
  Filled 2017-12-25: qty 1

## 2017-12-25 MED ORDER — EPHEDRINE SULFATE 50 MG/ML IJ SOLN
INTRAMUSCULAR | Status: DC | PRN
  Administered 2017-12-25 (×3): 10 mg via INTRAVENOUS

## 2017-12-25 MED ORDER — LIDOCAINE 2% (20 MG/ML) 5 ML SYRINGE
INTRAMUSCULAR | Status: AC
Start: 2017-12-25 — End: ?
  Filled 2017-12-25: qty 5

## 2017-12-25 MED ORDER — MIDAZOLAM HCL 2 MG/2ML IJ SOLN
INTRAMUSCULAR | Status: AC
  Filled 2017-12-25: qty 2

## 2017-12-25 MED ORDER — CIPROFLOXACIN IN D5W 400 MG/200ML IV SOLN
400.0000 mg | INTRAVENOUS | Status: AC
  Administered 2017-12-25: 400 mg via INTRAVENOUS
  Filled 2017-12-25: qty 200

## 2017-12-25 MED ORDER — MORPHINE SULFATE (PF) 2 MG/ML IV SOLN
2.0000 mg | INTRAVENOUS | Status: DC | PRN
  Filled 2017-12-25: qty 1

## 2017-12-25 MED ORDER — PROPOFOL 10 MG/ML IV BOLUS
INTRAVENOUS | Status: DC | PRN
  Administered 2017-12-25: 30 mg via INTRAVENOUS
  Administered 2017-12-25: 140 mg via INTRAVENOUS

## 2017-12-25 MED ORDER — IOHEXOL 300 MG/ML  SOLN
INTRAMUSCULAR | Status: DC | PRN
  Administered 2017-12-25: 7 mL

## 2017-12-25 MED ORDER — SODIUM CHLORIDE 0.9% FLUSH
3.0000 mL | Freq: Two times a day (BID) | INTRAVENOUS | Status: DC
  Filled 2017-12-25: qty 3

## 2017-12-25 MED ORDER — FENTANYL CITRATE (PF) 100 MCG/2ML IJ SOLN
INTRAMUSCULAR | Status: DC | PRN
  Administered 2017-12-25 (×2): 25 ug via INTRAVENOUS
  Administered 2017-12-25: 50 ug via INTRAVENOUS
  Administered 2017-12-25 (×4): 25 ug via INTRAVENOUS

## 2017-12-25 MED ORDER — PHENYLEPHRINE 40 MCG/ML (10ML) SYRINGE FOR IV PUSH (FOR BLOOD PRESSURE SUPPORT)
PREFILLED_SYRINGE | INTRAVENOUS | Status: DC | PRN
  Administered 2017-12-25 (×2): 80 ug via INTRAVENOUS
  Administered 2017-12-25 (×3): 120 ug via INTRAVENOUS

## 2017-12-25 MED ORDER — ONDANSETRON HCL 4 MG/2ML IJ SOLN
INTRAMUSCULAR | Status: AC
Start: 2017-12-25 — End: ?
  Filled 2017-12-25: qty 2

## 2017-12-25 SURGICAL SUPPLY — 31 items
BAG URINE DRAINAGE (UROLOGICAL SUPPLIES) ×4 IMPLANT
BLADE CLIPPER SURG (BLADE) ×4 IMPLANT
CATH FOLEY 2WAY SLVR  5CC 16FR (CATHETERS) ×2
CATH FOLEY 2WAY SLVR 5CC 16FR (CATHETERS) ×2 IMPLANT
CATH ROBINSON RED A/P 16FR (CATHETERS) IMPLANT
CATH ROBINSON RED A/P 20FR (CATHETERS) ×4 IMPLANT
CLOTH BEACON ORANGE TIMEOUT ST (SAFETY) ×4 IMPLANT
CONT SPECI 4OZ STER CLIK (MISCELLANEOUS) ×8 IMPLANT
COVER BACK TABLE 60X90IN (DRAPES) ×4 IMPLANT
COVER MAYO STAND STRL (DRAPES) ×4 IMPLANT
DRSG TEGADERM 4X4.75 (GAUZE/BANDAGES/DRESSINGS) ×4 IMPLANT
DRSG TEGADERM 8X12 (GAUZE/BANDAGES/DRESSINGS) ×8 IMPLANT
GAUZE SPONGE 4X4 8PLY STR LF (GAUZE/BANDAGES/DRESSINGS) ×4 IMPLANT
GLOVE ECLIPSE 8.0 STRL XLNG CF (GLOVE) ×8 IMPLANT
GLOVE SURG SS PI 8.0 STRL IVOR (GLOVE) ×8 IMPLANT
GOWN STRL REUS W/ TWL XL LVL3 (GOWN DISPOSABLE) ×2 IMPLANT
GOWN STRL REUS W/TWL XL LVL3 (GOWN DISPOSABLE) ×6 IMPLANT
HOLDER FOLEY CATH W/STRAP (MISCELLANEOUS) ×4 IMPLANT
IMPL SPACEOAR SYSTEM 10ML (MISCELLANEOUS) ×2 IMPLANT
IMPLANT SPACEOAR SYSTEM 10ML (MISCELLANEOUS) ×4
IV NS 1000ML (IV SOLUTION) ×2
IV NS 1000ML BAXH (IV SOLUTION) ×2 IMPLANT
KIT TURNOVER CYSTO (KITS) ×4 IMPLANT
MARKER SKIN DUAL TIP RULER LAB (MISCELLANEOUS) ×4 IMPLANT
PACK CYSTO (CUSTOM PROCEDURE TRAY) ×4 IMPLANT
SURGILUBE 2OZ TUBE FLIPTOP (MISCELLANEOUS) IMPLANT
SYR 10ML LL (SYRINGE) IMPLANT
UNDERPAD 30X30 (UNDERPADS AND DIAPERS) ×8 IMPLANT
WATER STERILE IRR 3000ML UROMA (IV SOLUTION) IMPLANT
WATER STERILE IRR 500ML POUR (IV SOLUTION) ×4 IMPLANT
i-seed agx100 ×4 IMPLANT

## 2017-12-25 NOTE — Transfer of Care (Signed)
Immediate Anesthesia Transfer of Care Note  Patient: Stephen Stone  Procedure(s) Performed: RADIOACTIVE SEED IMPLANT/BRACHYTHERAPY IMPLANT (N/A ) SPACE OAR INSTILLATION (N/A ) CYSTOSCOPY  Patient Location: PACU  Anesthesia Type:General  Level of Consciousness: awake, alert  and oriented  Airway & Oxygen Therapy: Patient Spontanous Breathing and Patient connected to nasal cannula oxygen  Post-op Assessment: Report given to RN and Post -op Vital signs reviewed and stable  Post vital signs: Reviewed and stable  Last Vitals:  Vitals Value Taken Time  BP    Temp 36.9 C 12/25/2017 11:22 AM  Pulse 84 12/25/2017 11:22 AM  Resp    SpO2 94 % 12/25/2017 11:22 AM  Vitals shown include unvalidated device data.  Last Pain:  Vitals:   12/25/17 0801  TempSrc:   PainSc: 0-No pain      Patients Stated Pain Goal: 5 (82/[id number])  Complications: No apparent anesthesia complications

## 2017-12-25 NOTE — Anesthesia Preprocedure Evaluation (Signed)
Anesthesia Evaluation  Patient identified by MRN, date of birth, ID band Patient awake    Reviewed: Allergy & Precautions, NPO status , Patient's Chart, lab work & pertinent test results  Airway Mallampati: II  TM Distance: >3 FB Neck ROM: Full    Dental no notable dental hx.    Pulmonary Current Smoker,    Pulmonary exam normal breath sounds clear to auscultation       Cardiovascular + CAD, + Past MI and + Cardiac Stents  Normal cardiovascular exam Rhythm:Regular Rate:Normal     Neuro/Psych negative neurological ROS  negative psych ROS   GI/Hepatic negative GI ROS, Neg liver ROS,   Endo/Other  negative endocrine ROS  Renal/GU negative Renal ROS  negative genitourinary   Musculoskeletal negative musculoskeletal ROS (+)   Abdominal   Peds negative pediatric ROS (+)  Hematology negative hematology ROS (+)   Anesthesia Other Findings   Reproductive/Obstetrics negative OB ROS                             Anesthesia Physical Anesthesia Plan  ASA: III  Anesthesia Plan: General   Post-op Pain Management:    Induction: Intravenous  PONV Risk Score and Plan: 2 and Ondansetron, Dexamethasone and Treatment may vary due to age or medical condition  Airway Management Planned: LMA  Additional Equipment:   Intra-op Plan:   Post-operative Plan: Extubation in OR  Informed Consent: I have reviewed the patients History and Physical, chart, labs and discussed the procedure including the risks, benefits and alternatives for the proposed anesthesia with the patient or authorized representative who has indicated his/her understanding and acceptance.   Dental advisory given  Plan Discussed with: CRNA and Surgeon  Anesthesia Plan Comments:         Anesthesia Quick Evaluation

## 2017-12-25 NOTE — Op Note (Addendum)
PATIENT:  [name]  PRE-OPERATIVE DIAGNOSIS:  Adenocarcinoma of the prostate  POST-OPERATIVE DIAGNOSIS:  Same  PROCEDURE:  Procedure(s): 1. I-125 radioactive seed implantation 2. Cystoscopy  SURGEON:  Surgeon(s): [name] MD  Radiation oncologist: Dr. Tyler Pita  ANESTHESIA:  General  EBL:  Minimal  DRAINS: 2 French Foley catheter  INDICATION: Stephen Stone is a 62 y.o. with Stage T1c, Gleason 6 prostate cancer who has elected brachytherapy for treatment.  Description of procedure: After informed consent the patient was brought to the major OR, placed on the table and administered general anesthesia. He was then moved to the modified lithotomy position with his perineum perpendicular to the floor. His perineum and genitalia were then sterilely prepped. An official timeout was then performed. A 16 French Foley catheter was then placed in the bladder and filled with dilute contrast, a rectal tube was placed in the rectum and the transrectal ultrasound probe was placed in the rectum and affixed to the stand. He was then sterilely draped.  The sterile grid was installed.   Anchor needles were then placed.   Real time ultrasonography was used along with the seed planning software spot-pro version 3.1-00. This was used to develop the seed plan including the number of needles as well as number of seeds required for complete and adequate coverage. Real-time ultrasonography was then used along with the previously developed plan to place 77 seeds stranded in the OR per the plan in 19 needles for a target dose of 110 Gy. This proceeded without difficulty or complication.  After the seed placement, the guide was removed and SpaceOAR gel was injected per routine technique posterior to the prostate with excellent distribution in the fat plane under US guidance.   A Foley catheter was then removed as well as the transrectal ultrasound probe and rectal probe. Flexible cystoscopy was  then performed using the 17 French flexible scope which revealed a normal urethra throughout its length down to the sphincter which appeared intact. 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 The bladder was then entered and fully and systematically inspected.  The ureteral orifices were noted to be of normal configuration and position. The mucosa revealed no evidence of tumors.  There was mild trabeculation and a small diverticulum on the dome.  There were also no stones identified within the bladder.  No seeds or spacers were seen and/or removed from the bladder.  The cystoscope was then removed.  The drapes were removed.  The perineum was cleaned and dressed.  He was taken out of the lithotomy position and was awakened and taken to recovery room in stable and satisfactory condition. He tolerated procedure well and there were no intraoperative complications.

## 2017-12-25 NOTE — Anesthesia Postprocedure Evaluation (Signed)
Anesthesia Post Note  Patient: Stephen Stone  Procedure(Stone) Performed: RADIOACTIVE SEED IMPLANT/BRACHYTHERAPY IMPLANT (N/A ) SPACE OAR INSTILLATION (N/A ) CYSTOSCOPY     Patient location during evaluation: PACU Anesthesia Type: General Level of consciousness: awake and alert Pain management: pain level controlled Vital Signs Assessment: post-procedure vital signs reviewed and stable Respiratory status: spontaneous breathing, nonlabored ventilation, respiratory function stable and patient connected to nasal cannula oxygen Cardiovascular status: blood pressure returned to baseline and stable Postop Assessment: no apparent nausea or vomiting Anesthetic complications: no    Last Vitals:  Vitals:   12/25/17 1130 12/25/17 1145  BP: 109/76 117/75  Pulse: 80 77  Resp: 10 19  Temp:    SpO2: 96% 97%    Last Pain:  Vitals:   12/25/17 1145  TempSrc:   PainSc: 0-No pain                 Stephen Stone

## 2017-12-25 NOTE — Discharge Instructions (Signed)
Post Anesthesia Home Care Instructions  Activity: Get plenty of rest for the remainder of the day. A responsible individual must stay with you for 24 hours following the procedure.  For the next 24 hours, DO NOT: -Drive a car -Paediatric nurse -Drink alcoholic beverages -Take any medication unless instructed by your physician -Make any legal decisions or sign important papers.  Meals: Start with liquid foods such as gelatin or soup. Progress to regular foods as tolerated. Avoid greasy, spicy, heavy foods. If nausea and/or vomiting occur, drink only clear liquids until the nausea and/or vomiting subsides. Call your physician if vomiting continues.  Special Instructions/Symptoms: Your throat may feel dry or sore from the anesthesia or the breathing tube placed in your throat during surgery. If this causes discomfort, gargle with warm salt water. The discomfort should disappear within 24 hours.  If you had a scopolamine patch placed behind your ear for the management of post- operative nausea and/or vomiting:  1. The medication in the patch is effective for 72 hours, after which it should be removed.  Wrap patch in a tissue and discard in the trash. Wash hands thoroughly with soap and water. 2. You may remove the patch earlier than 72 hours if you experience unpleasant side effects which may include dry mouth, dizziness or visual disturbances. 3. Avoid touching the patch. Wash your hands with soap and water after contact with the patch.     Brachytherapy for Prostate Cancer, Care After Refer to this sheet in the next few weeks. These instructions provide you with information on caring for yourself after your procedure. Your health care provider may also give you more specific instructions. Your treatment has been planned according to current medical practices, but problems sometimes occur. Call your health care provider if you have any problems or questions after your procedure. What can I  expect after the procedure? The area behind the scrotum will probably be tender and bruised. For a short period of time you may have:  Difficulty passing urine. You may need a catheter for a few days to a month.  Blood in the urine or semen.  A feeling of constipation because of prostate swelling.  Frequent feeling of an urgent need to urinate.  For a long period of time you may have:  Inflammation of the rectum. This happens in about 2% of people who have the procedure.  Erection problems. These vary with age and occur in about 15-40% of men.  Difficulty urinating. This is caused by scarring in the urethra.  Diarrhea.  Follow these instructions at home:  Take medicines only as directed by your health care provider.  You will probably have a catheter in your bladder for several days. You will have blood in the urine bag and should drink a lot of fluids to keep it a light red color.  Keep all follow-up visits as directed by your health care provider. If you have a catheter, it will be removed during one of these visits.  Try not to sit directly on the area behind the scrotum. A soft cushion can decrease the discomfort. Ice packs may also be helpful for the discomfort. Do not put ice directly on the skin.  Shower and wash the area behind the scrotum gently. Do not sit in a tub.  If you have had the brachytherapy that uses the seeds, limit your close contact with children and pregnant women for 2 months because of the radiation still in the prostate. After that period  of time, the levels drop off quickly. Get help right away if:  You have a fever.  You have chills.  You have shortness of breath.  You have chest pain.  You have thick blood, like tomato juice, in the urine bag.  Your catheter is blocked so urine cannot get into the bag. Your bladder area or lower abdomen may be swollen.  There is excessive bleeding from your rectum. It is normal to have a little blood mixed  with your stool.  There is severe discomfort in the treated area that does not go away with pain medicine.  You have abdominal discomfort.  You have severe nausea or vomiting.  You develop any new or unusual symptoms. This information is not intended to replace advice given to you by your health care provider. Make sure you discuss any questions you have with your health care provider. Document Released: 08/17/2010 Document Revised: 12/27/2015 Document Reviewed: 01/05/2013 Elsevier Interactive Patient Education  2017 [employer].

## 2017-12-25 NOTE — Anesthesia Procedure Notes (Signed)
Procedure Name: LMA Insertion Date/Time: 12/25/2017 9:35 AM Performed by: Genelle Bal, CRNA Pre-anesthesia Checklist: Patient identified, Emergency Drugs available, Suction available and Patient being monitored Patient Re-evaluated:Patient Re-evaluated prior to induction Oxygen Delivery Method: Circle system utilized Preoxygenation: Pre-oxygenation with 100% oxygen Induction Type: IV induction Ventilation: Mask ventilation without difficulty LMA: LMA inserted LMA Size: 5.0 Number of attempts: 1 Placement Confirmation: positive ETCO2 Tube secured with: Tape Dental Injury: Teeth and Oropharynx as per pre-operative assessment

## 2017-12-25 NOTE — Interval H&P Note (Signed)
History and Physical Interval Note:  He had questions about the number of sticks required to deploy the seeds and those were answered.   12/25/2017 9:09 AM  Stephen Stone  has presented today for surgery, with the diagnosis of PROSTATE CANCER  The various methods of treatment have been discussed with the patient and family. After consideration of risks, benefits and other options for treatment, the patient has consented to  Procedure(s): RADIOACTIVE SEED IMPLANT/BRACHYTHERAPY IMPLANT (N/A) SPACE OAR INSTILLATION (N/A) as a surgical intervention .  The patient's history has been reviewed, patient examined, no change in status, stable for surgery.  I have reviewed the patient's chart and labs.  Questions were answered to the patient's satisfaction.     Stephen Stone

## 2017-12-26 ENCOUNTER — Encounter (HOSPITAL_BASED_OUTPATIENT_CLINIC_OR_DEPARTMENT_OTHER): Payer: Self-pay | Admitting: Urology

## 2017-12-30 NOTE — Progress Notes (Signed)
  Radiation Oncology         [phone] ________________________________  Name: Stephen Stone MRN: [id number]  Date: 12/30/2017  DOB: [birth year]       Prostate Seed Implant  QT:MAUQJ, Lesli Albee, PA-C  No ref. provider found  DIAGNOSIS: 62 y.o. gentleman with Stage T1c adenocarcinoma of the prostate with Gleason Score of 3+4, and PSA of 25.20    ICD-10-CM   1. Prostate cancer ([place]) C61 DG Chest 2 View    DG Chest 2 View    PROCEDURE: Insertion of radioactive I-125 seeds into the prostate gland.  RADIATION DOSE: 145 Gy, definitive therapy.  TECHNIQUE: [name] was brought to the operating room with the urologist. He was placed in the dorsolithotomy position. He was catheterized and a rectal tube was inserted. The perineum was shaved, prepped and draped. The ultrasound probe was then introduced into the rectum to see the prostate gland.  TREATMENT DEVICE: A needle grid was attached to the ultrasound probe stand and anchor needles were placed.  3D PLANNING: The prostate was imaged in 3D using a sagittal sweep of the prostate probe. These images were transferred to the planning computer. There, the prostate, urethra and rectum were defined on each axial reconstructed image. Then, the software created an optimized 3D plan and a few seed positions were adjusted. The quality of the plan was reviewed using [facility] information for the target and the following two organs at risk:  Urethra and Rectum.  Then the accepted plan was printed and handed off to the radiation therapist.  Under my supervision, the custom loading of the seeds and spacers was carried out and loaded into sealed vicryl sleeves.  These pre-loaded needles were then placed into the needle holder.Marland Kitchen  PROSTATE VOLUME STUDY:  Using transrectal ultrasound the volume of the prostate was verified to be 58 cc.  SPECIAL TREATMENT PROCEDURE/SUPERVISION AND HANDLING: The pre-loaded needles were then delivered under sagittal guidance.  A total of 19 needles were used to deposit 77 seeds in the prostate gland. The individual seed activity was 0.415 mCi.  SpaceOAR:  Yes  COMPLEX SIMULATION: At the end of the procedure, an anterior radiograph of the pelvis was obtained to document seed positioning and count. Cystoscopy was performed to check the urethra and bladder.  MICRODOSIMETRY: At the end of the procedure, the patient was emitting 0.05 mR/hr at 1 meter. Accordingly, he was considered safe for hospital discharge.  PLAN: The patient will return to the radiation oncology clinic for post implant CT dosimetry in three weeks.   ________________________________  Sheral Apley Tammi Klippel, M.D.

## 2018-01-08 ENCOUNTER — Ambulatory Visit (HOSPITAL_COMMUNITY): Payer: Medicare Other

## 2018-01-08 ENCOUNTER — Telehealth: Payer: Self-pay | Admitting: *Deleted

## 2018-01-08 NOTE — Telephone Encounter (Signed)
Called patient to remind of post seed appts. For 01-09-18, spoke with patient and he is aware of these appts.

## 2018-01-09 ENCOUNTER — Ambulatory Visit
Admission: RE | Admit: 2018-01-09 | Discharge: 2018-01-09 | Disposition: A | Discharge status: Home / Self Care | Payer: Medicare Other | Source: Ambulatory Visit | Attending: Radiation Oncology | Admitting: Radiation Oncology

## 2018-01-09 ENCOUNTER — Ambulatory Visit (HOSPITAL_COMMUNITY)
Admission: RE | Admit: 2018-01-09 | Discharge: 2018-01-09 | Disposition: A | Discharge status: Home / Self Care | Payer: Medicare Other | Source: Ambulatory Visit | Attending: Urology | Admitting: Urology

## 2018-01-09 DIAGNOSIS — C61 Malignant neoplasm of prostate: Secondary | ICD-10-CM | POA: Diagnosis not present

## 2018-01-09 DIAGNOSIS — F17210 Nicotine dependence, cigarettes, uncomplicated: Secondary | ICD-10-CM | POA: Insufficient documentation

## 2018-01-09 DIAGNOSIS — Z7982 Long term (current) use of aspirin: Secondary | ICD-10-CM | POA: Diagnosis not present

## 2018-01-09 DIAGNOSIS — E785 Hyperlipidemia, unspecified: Secondary | ICD-10-CM | POA: Diagnosis not present

## 2018-01-09 DIAGNOSIS — Z510 Encounter for antineoplastic radiation therapy: Secondary | ICD-10-CM | POA: Insufficient documentation

## 2018-01-09 DIAGNOSIS — Z79899 Other long term (current) drug therapy: Secondary | ICD-10-CM | POA: Insufficient documentation

## 2018-01-09 DIAGNOSIS — I2510 Atherosclerotic heart disease of native coronary artery without angina pectoris: Secondary | ICD-10-CM | POA: Insufficient documentation

## 2018-01-09 NOTE — Progress Notes (Signed)
  Radiation Oncology         [phone] ________________________________  Name: Burnis Halling MRN: [id number]  Date: 01/09/2018  DOB: [birth year]  COMPLEX SIMULATION NOTE  DIAGNOSIS: 62 y.o. gentleman with Stage T1c adenocarcinoma of the prostate with Gleason Score of 3+4, and PSA of 25.20  NARRATIVE:  The patient was brought to the [facility] today following prostate seed implantation approximately one month ago.  Identity was confirmed.  All relevant records and images related to the planned course of therapy were reviewed.  Then, the patient was set-up supine.  CT images were obtained.  The CT images were loaded into the planning software.  Then the prostate and rectum were contoured.  Treatment planning then occurred.  The implanted iodine 125 seeds were identified by the physics staff for projection of radiation distribution  I have requested : 3D Simulation  I have requested a DVH of the following structures: Prostate and rectum.    ________________________________  Sheral Apley Tammi Klippel, M.D.

## 2018-01-09 NOTE — Progress Notes (Signed)
  Radiation Oncology         [phone] ________________________________  Name: Stephen Stone MRN: [id number]  Date: 01/09/2018  DOB: 10/14/1955  SIMULATION AND TREATMENT PLANNING NOTE    ICD-10-CM   1. Prostate cancer ([place]) C61     DIAGNOSIS:  62 y.o. gentleman with Stage T1c adenocarcinoma of the prostate with Gleason Score of 3+4, and PSA of 25.20.  NARRATIVE:  The patient was brought to the [facility].  Identity was confirmed.  All relevant records and images related to the planned course of therapy were reviewed.  The patient freely provided informed written consent to proceed with treatment after reviewing the details related to the planned course of therapy. The consent form was witnessed and verified by the simulation staff.  Then, the patient was set-up in a stable reproducible supine position for radiation therapy.  A vacuum lock pillow device was custom fabricated to position his legs in a reproducible immobilized position.  Then, I performed a urethrogram under sterile conditions to identify the prostatic apex.  CT images were obtained.  Surface markings were placed.  The CT images were loaded into the planning software.  Then the prostate target and avoidance structures including the rectum, bladder, bowel and hips were contoured.  Treatment planning then occurred.  The radiation prescription was entered and confirmed.  A total of one complex treatment devices were fabricated. I have requested : Intensity Modulated Radiotherapy (IMRT) is medically necessary for this case for the following reason:  Rectal sparing.Marland Kitchen  PLAN:  The patient will receive 45 Gy in 25 fractions of 1.8 Gy, to supplement an up-front prostate seed implant boost of 110 Gy to achieve a total nominal dose of 155 Gy.  ________________________________  Sheral Apley Tammi Klippel, M.D.

## 2018-01-16 ENCOUNTER — Encounter: Payer: Self-pay | Admitting: Radiation Oncology

## 2018-01-16 DIAGNOSIS — Z510 Encounter for antineoplastic radiation therapy: Secondary | ICD-10-CM | POA: Diagnosis not present

## 2018-01-19 ENCOUNTER — Ambulatory Visit: Payer: Medicare Other

## 2018-01-20 ENCOUNTER — Ambulatory Visit: Payer: Medicare Other

## 2018-01-21 ENCOUNTER — Ambulatory Visit: Payer: Medicare Other

## 2018-01-21 ENCOUNTER — Other Ambulatory Visit: Payer: Self-pay

## 2018-01-21 ENCOUNTER — Ambulatory Visit (HOSPITAL_COMMUNITY): Payer: Medicare Other | Attending: Cardiology

## 2018-01-21 DIAGNOSIS — E785 Hyperlipidemia, unspecified: Secondary | ICD-10-CM | POA: Insufficient documentation

## 2018-01-21 DIAGNOSIS — I519 Heart disease, unspecified: Secondary | ICD-10-CM

## 2018-01-21 DIAGNOSIS — Z8546 Personal history of malignant neoplasm of prostate: Secondary | ICD-10-CM | POA: Insufficient documentation

## 2018-01-21 DIAGNOSIS — I2510 Atherosclerotic heart disease of native coronary artery without angina pectoris: Secondary | ICD-10-CM | POA: Insufficient documentation

## 2018-01-21 DIAGNOSIS — I10 Essential (primary) hypertension: Secondary | ICD-10-CM | POA: Insufficient documentation

## 2018-01-21 DIAGNOSIS — I5189 Other ill-defined heart diseases: Secondary | ICD-10-CM

## 2018-01-22 ENCOUNTER — Ambulatory Visit: Payer: Medicare Other

## 2018-01-23 ENCOUNTER — Ambulatory Visit: Payer: Medicare Other

## 2018-01-25 NOTE — Progress Notes (Signed)
  Radiation Oncology         [phone] ________________________________  Name: Stephen Stone MRN: [id number]  Date: 01/16/2018  DOB: Nov 29, 1955  3D Planning Note   Prostate Brachytherapy Post-Implant Dosimetry  Diagnosis: 62 y.o. gentleman with Stage T1c adenocarcinoma of the prostate with Gleason Score of 3+4, and PSA of 25.2  Narrative: On a previous date, Stephen Stone returned following prostate seed implantation for post implant planning. He underwent CT scan complex simulation to delineate the three-dimensional structures of the pelvis and demonstrate the radiation distribution.  Since that time, the seed localization, and complex isodose planning with dose volume histograms have now been completed.  Results:   Prostate Coverage - The dose of radiation delivered to the 90% or more of the prostate gland (D90) was 94.73% of the prescription dose. This exceeds our goal of greater than 90%. Rectal Sparing - The volume of rectal tissue receiving the prescription dose or higher was 0.07 cc. This falls under our thresholds tolerance of 1.0 cc.  Impression: The prostate seed implant appears to show adequate target coverage and appropriate rectal sparing.  Plan:  The patient will continue to follow with urology for ongoing PSA determinations. I would anticipate a high likelihood for local tumor control with minimal risk for rectal morbidity.  ________________________________  Sheral Apley Tammi Klippel, M.D.

## 2018-01-26 ENCOUNTER — Ambulatory Visit: Payer: Medicare Other

## 2018-01-26 DIAGNOSIS — C61 Malignant neoplasm of prostate: Secondary | ICD-10-CM | POA: Insufficient documentation

## 2018-01-26 DIAGNOSIS — Z510 Encounter for antineoplastic radiation therapy: Secondary | ICD-10-CM | POA: Diagnosis present

## 2018-01-27 ENCOUNTER — Ambulatory Visit: Payer: Medicare Other

## 2018-01-28 ENCOUNTER — Ambulatory Visit: Payer: Medicare Other

## 2018-01-28 NOTE — ED Triage Notes (Signed)
Pt BIB [facility] d/t 10/10 L.sided CP that's been going on for x2 days that radiated to shoulder. Per Pt he took 2-325 ASA. Per EMS Pt denied N/V/Diz. Pt has Hx of Prostate Cx w/ Radiation markers for upcoming radiation starting on 02/02/18

## 2018-01-29 ENCOUNTER — Other Ambulatory Visit: Payer: Self-pay

## 2018-01-29 ENCOUNTER — Emergency Department (HOSPITAL_COMMUNITY)
Admission: EM | Admit: 2018-01-29 | Discharge: 2018-01-29 | Disposition: A | Discharge status: Home / Self Care | Payer: Medicare Other | Attending: Emergency Medicine | Admitting: Emergency Medicine

## 2018-01-29 ENCOUNTER — Emergency Department (HOSPITAL_COMMUNITY): Payer: Medicare Other

## 2018-01-29 ENCOUNTER — Encounter (HOSPITAL_COMMUNITY): Payer: Self-pay

## 2018-01-29 DIAGNOSIS — I2510 Atherosclerotic heart disease of native coronary artery without angina pectoris: Secondary | ICD-10-CM | POA: Diagnosis not present

## 2018-01-29 DIAGNOSIS — R079 Chest pain, unspecified: Secondary | ICD-10-CM | POA: Diagnosis present

## 2018-01-29 DIAGNOSIS — Z79899 Other long term (current) drug therapy: Secondary | ICD-10-CM | POA: Insufficient documentation

## 2018-01-29 DIAGNOSIS — F17210 Nicotine dependence, cigarettes, uncomplicated: Secondary | ICD-10-CM | POA: Insufficient documentation

## 2018-01-29 DIAGNOSIS — R072 Precordial pain: Secondary | ICD-10-CM | POA: Diagnosis not present

## 2018-01-29 DIAGNOSIS — Z7982 Long term (current) use of aspirin: Secondary | ICD-10-CM | POA: Insufficient documentation

## 2018-01-29 LAB — BASIC METABOLIC PANEL
ANION GAP: 9 (ref 5–15)
BUN: 15 mg/dL (ref 8–23)
CO2: 20 mmol/L — ABNORMAL LOW (ref 22–32)
CREATININE: 0.91 mg/dL (ref 0.61–1.24)
Calcium: 8.9 mg/dL (ref 8.9–10.3)
Chloride: 110 mmol/L (ref 98–111)
GLUCOSE: 127 mg/dL — AB (ref 70–99)
Potassium: 4.1 mmol/L (ref 3.5–5.1)
Sodium: 139 mmol/L (ref 135–145)

## 2018-01-29 LAB — CBC WITH DIFFERENTIAL/PLATELET
Abs Immature Granulocytes: 0 10*3/uL (ref 0.0–0.1)
BASOS ABS: 0.1 10*3/uL (ref 0.0–0.1)
Basophils Relative: 1 %
EOS ABS: 0.4 10*3/uL (ref 0.0–0.7)
Eosinophils Relative: 4 %
HEMATOCRIT: 39.4 % (ref 39.0–52.0)
Hemoglobin: 13.1 g/dL (ref 13.0–17.0)
Immature Granulocytes: 0 %
Lymphocytes Relative: 26 %
Lymphs Abs: 2.5 10*3/uL (ref 0.7–4.0)
MCH: 30.3 pg (ref 26.0–34.0)
MCHC: 33.2 g/dL (ref 30.0–36.0)
MCV: 91 fL (ref 78.0–100.0)
MONO ABS: 0.8 10*3/uL (ref 0.1–1.0)
Monocytes Relative: 8 %
NEUTROS ABS: 5.9 10*3/uL (ref 1.7–7.7)
NEUTROS PCT: 61 %
Platelets: 183 10*3/uL (ref 150–400)
RBC: 4.33 MIL/uL (ref 4.22–5.81)
RDW: 13 % (ref 11.5–15.5)
WBC: 9.7 10*3/uL (ref 4.0–10.5)

## 2018-01-29 LAB — I-STAT TROPONIN, ED
Troponin i, poc: 0 ng/mL (ref 0.00–0.08)
Troponin i, poc: 0.01 ng/mL (ref 0.00–0.08)

## 2018-01-29 NOTE — Discharge Instructions (Addendum)

## 2018-01-29 NOTE — ED Provider Notes (Signed)
[facility] EMERGENCY DEPARTMENT Provider Note   CSN: [id number] Arrival date & time: 01/29/18  0002     History   Chief Complaint Chief Complaint  Patient presents with  . Chest Pain    HPI Stephen Stone is a 62 y.o. male.  The history is provided by the patient.  Chest Pain   This is a new problem. Episode onset: prior To arrival. The problem occurs daily. The problem has been resolved. The pain is associated with exertion. Pain location: left chest  The pain is moderate. The quality of the pain is described as sharp. The pain radiates to the left shoulder. Associated symptoms include numbness. Pertinent negatives include no diaphoresis, no nausea and no shortness of breath. Associated symptoms comments: numbness. He has tried rest (Aspirin) for the symptoms. The treatment provided moderate relief. Risk factors include being elderly.  His past medical history is significant for CAD.  Patient With known history of CAD presents with chest pain.  He reports for the past several days he will have onset of left-sided chest pain that is sharp in nature radiates into the shoulder and has numbness in the shoulder.  Denies any shortness of breath, denies diaphoresis. He reports it occurs sometimes when he is working out usually when lifting arms above head   Tonight the episode occurred while listening to TV, and  he called 911.  He took an aspirin, now his pain is resolved.  The episodes usually last 5 to 10 minutes. Reports it feels different from his previous MI.  Past Medical History:  Diagnosis Date  . Anxiety   . Coronary artery disease cardioloigst-  dr Stanford Breed--- previously cardiologist from [facility]   hx anterior wall STEMI,  10-05-2011  cardiac cath w/ PCI and BM stent x1 to distal LAD  . Depression   . 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 History of ST elevation myocardial infarction (STEMI) 10/05/2011    [place], [place]   acute  anterior wall--- s/p  Cardiac cath w/ PCI and stent to distal LAD  . Hyperlipidemia   . 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 Prostate cancer [facility]) urologist-  dr wrenn/  onologist-  dr Tammi Klippel   dx Nov 2017 , Gleason 6, in [place]--  and repeat bx 08-19-2017 ,  Stage T1c, Gleason 3+4, PSA 25.20, vol 47cc-- scheduled for radioactive seed implants 12-25-2017  . S/p bare metal coronary artery stent 10-05-2011   [place], [place]   BM stent x1 to distal LAD    Patient Active Problem List   Diagnosis Date Noted  . Prostate cancer ([place]) 03/19/2017    Past Surgical History:  Procedure Laterality Date  . CARDIOVASCULAR STRESS TEST  04-23-2017   dr Stanford Breed   Low Risk Nuclear perfusion study w/ no evidence ischemia/  normal LV function and wall motion,  nuclear stress ef 58%  . CORONARY ANGIOPLASTY WITH STENT PLACEMENT  10-05-2011   [facility] in [place], [place] (records scanned in epic)   in setting anterior STEMI-- PCI and BM (Vision) stent x1 to distal LAD normal LVEF 60% with hypokinesis in the mid portion tf the anterolateral wall (30% mLAD,  30% CFx , 95% dLAD)  . CYSTOSCOPY N/A 08/19/2017   Procedure: CYSTOSCOPY;  Surgeon: [name], MD;  Location: [facility];  Service: Urology;  Laterality: N/A;  . CYSTOSCOPY  12/25/2017   Procedure: CYSTOSCOPY;  Surgeon: [name], MD;  [facility];  Service: Urology;;  no seeds noted in bladder  . PROSTATE BIOPSY N/A 08/19/2017   Procedure: BIOPSY TRANSRECTAL ULTRASONIC PROSTATE (TUBP);  Surgeon: [name], MD;  Location: [facility];  Service: Urology;  Laterality: N/A;  . RADIOACTIVE SEED IMPLANT N/A 12/25/2017   Procedure: RADIOACTIVE SEED IMPLANT/BRACHYTHERAPY IMPLANT;  Surgeon: [name], MD;  [facility]  Service: Urology;  Laterality: N/A;  77 seeds implanted  . SPACE OAR INSTILLATION N/A 12/25/2017     Procedure: SPACE OAR INSTILLATION;  Surgeon: [name], MD;  [facility];  Service: Urology;  Laterality: N/A;  . TRANSTHORACIC ECHOCARDIOGRAM  03-21-2016    Dr Ronal Fear [facility])   mild concentric LVH, ef 50-53%, grade 1 diastolic dysfunction/ mild PR/ trace TR        Home Medications    Prior to Admission medications   Medication Sig Start Date End Date Taking? Authorizing Provider  aspirin 325 MG EC tablet Take 1 tablet (325 mg total) by mouth daily. 03/13/17   [name], PA-C  atorvastatin (LIPITOR) 80 MG tablet Take 1 tablet (80 mg total) by mouth every evening. Patient taking differently: Take 80 mg by mouth every evening.  03/13/17   [name], PA-C  lisinopril (PRINIVIL,ZESTRIL) 20 MG tablet Take 1 tablet (20 mg total) by mouth daily. Patient taking differently: Take 20 mg by mouth every morning.  03/13/17   [name], PA-C  metoprolol succinate (TOPROL-XL) 100 MG 24 hr tablet Take 1 tablet (100 mg total) by mouth daily. Take with or immediately following a meal. Patient taking differently: Take 100 mg by mouth daily. Take with or immediately following a meal.  03/13/17   [name], PA-C  QUEtiapine (SEROQUEL) 200 MG tablet Take 1 tablet (200 mg total) by mouth at bedtime. 03/13/17   [name], PA-C  traMADol (ULTRAM) 50 MG tablet Take 1 tablet (50 mg total) by mouth every 6 (six) hours as needed. 12/25/17 12/25/18  [name], MD    Family History Family History  Problem Relation Age of Onset  . Cancer Mother        pancreatic  . CAD Father   . Cancer Brother        prostate  . Cancer Brother        prostate    Social History Social History   Tobacco Use  . Smoking status: Current Some Day Smoker    Packs/day: 0.25    Years: 6.00    Pack years: 1.50    Types: Cigarettes  . Smokeless tobacco: Never Used  Substance Use Topics  . Alcohol use: No  . Drug use: Yes    Types: Marijuana     Comment: can't remember last time     Allergies   Doxycycline   Review of Systems Review of Systems  Constitutional: Negative for diaphoresis.  Respiratory: Negative for shortness of breath.   Cardiovascular: Positive for chest pain.  Gastrointestinal: Negative for nausea.  Neurological: Positive for numbness. Negative for syncope.  All other systems reviewed and are negative.    Physical Exam Updated Vital Signs BP (!) 159/92 (BP Location: Right Arm)   Pulse 72   Temp 97.8 F (36.6 C) (Oral)   Resp 16   Ht 1.829 m (6')   Wt 102.5 kg (226 lb)   SpO2 97%  BMI 30.65 kg/m   Physical Exam CONSTITUTIONAL: Elderly, no acute distress HEAD: Normocephalic/atraumatic EYES: Blind ENMT: Mucous membranes moist NECK: supple no meningeal signs SPINE/BACK:entire spine nontender CV: S1/S2 noted, no murmurs/rubs/gallops noted LUNGS: Lungs are clear to auscultation bilaterally, no apparent distress Chest-no chest wall tenderness or bruising ABDOMEN: soft, nontender, no rebound or guarding, bowel sounds noted throughout abdomen GU:no cva tenderness NEURO: Pt is awake/alert/appropriate, moves all extremitiesx4.  No facial droop.   EXTREMITIES: pulses normal/equal, full ROM, no calf tenderness or edema SKIN: warm, color normal PSYCH: no abnormalities of mood noted, alert and oriented to situation  ED Treatments / Results  Labs (all labs ordered are listed, but only abnormal results are displayed) Labs Reviewed  BASIC METABOLIC PANEL - Abnormal; Notable for the following components:      Result Value   CO2 20 (*)    Glucose, Bld 127 (*)    All other components within normal limits  CBC WITH DIFFERENTIAL/PLATELET  I-STAT TROPONIN, ED  I-STAT TROPONIN, ED    EKG EKG Interpretation  Date/Time:  Thursday January 29 2018 00:11:59 EDT Ventricular Rate:  68 PR Interval:    QRS Duration: 93 QT Interval:  381 QTC Calculation: 406 R Axis:   5 Text Interpretation:  Sinus rhythm  Minimal ST elevation, anterior leads Confirmed by Ripley Fraise [phone]) on 01/29/2018 12:25:56 AM   Radiology Dg Chest 2 View  Result Date: 01/29/2018 CLINICAL DATA:  Chest pain tonight. History of heart stent and prostate seeds. Radiation therapy to begin on Monday. EXAM: CHEST - 2 VIEW COMPARISON:  11/21/2017 FINDINGS: The heart size and mediastinal contours are within normal limits. Both lungs are clear. The visualized skeletal structures are unremarkable. IMPRESSION: No active cardiopulmonary disease. Electronically Signed   By: Lucienne Capers M.D.   On: 01/29/2018 00:51    Procedures Procedures    Medications Ordered in ED Medications - No data to display   Initial Impression / Assessment and Plan / ED Course  I have reviewed the triage vital signs and the nursing notes.  Pertinent labs & imaging results that were available during my care of the patient were reviewed by me and considered in my medical decision making (see chart for details).     3:28 AM Patient resting comfortably, no new chest pain.   At time of discharge: Patient monitored in the ER for several hours.  He slept for several hours and felt improved.  Patient does have a history of CAD, and I  offered him admission due to his episode of chest pain.  However he tells me this is very unlike his previous MI.  He denies any shortness of breath/diaphoresis/vomiting.  He would prefer to go home at this time.  His history of chest pain was atypical.  We discussed strict return precautions, and he will call 911 for any worsening chest pain, diaphoresis, shortness of breath or vomiting Final Clinical Impressions(s) / ED Diagnoses   Final diagnoses:  Precordial pain    ED Discharge Orders    None       Ripley Fraise, MD 01/29/18 [phone]

## 2018-01-29 NOTE — ED Notes (Signed)
Patient transported to X-ray 

## 2018-01-30 ENCOUNTER — Ambulatory Visit: Payer: Medicare Other

## 2018-01-31 ENCOUNTER — Encounter (HOSPITAL_COMMUNITY): Payer: Self-pay | Admitting: Radiology

## 2018-01-31 ENCOUNTER — Emergency Department (HOSPITAL_COMMUNITY): Payer: Medicare Other

## 2018-01-31 ENCOUNTER — Emergency Department (HOSPITAL_COMMUNITY)
Admission: EM | Admit: 2018-01-31 | Discharge: 2018-01-31 | Disposition: A | Discharge status: Home / Self Care | Payer: Medicare Other | Attending: Emergency Medicine | Admitting: Emergency Medicine

## 2018-01-31 ENCOUNTER — Other Ambulatory Visit: Payer: Self-pay

## 2018-01-31 DIAGNOSIS — Z7982 Long term (current) use of aspirin: Secondary | ICD-10-CM | POA: Insufficient documentation

## 2018-01-31 DIAGNOSIS — R0789 Other chest pain: Secondary | ICD-10-CM | POA: Insufficient documentation

## 2018-01-31 DIAGNOSIS — Z8546 Personal history of malignant neoplasm of prostate: Secondary | ICD-10-CM | POA: Diagnosis not present

## 2018-01-31 DIAGNOSIS — I2510 Atherosclerotic heart disease of native coronary artery without angina pectoris: Secondary | ICD-10-CM | POA: Diagnosis not present

## 2018-01-31 DIAGNOSIS — F17210 Nicotine dependence, cigarettes, uncomplicated: Secondary | ICD-10-CM | POA: Insufficient documentation

## 2018-01-31 DIAGNOSIS — Z79899 Other long term (current) drug therapy: Secondary | ICD-10-CM | POA: Insufficient documentation

## 2018-01-31 LAB — CBC WITH DIFFERENTIAL/PLATELET
ABS IMMATURE GRANULOCYTES: 0 10*3/uL (ref 0.0–0.1)
BASOS PCT: 0 %
Basophils Absolute: 0 10*3/uL (ref 0.0–0.1)
Eosinophils Absolute: 0.3 10*3/uL (ref 0.0–0.7)
Eosinophils Relative: 2 %
HCT: 41.4 % (ref 39.0–52.0)
HEMOGLOBIN: 14 g/dL (ref 13.0–17.0)
IMMATURE GRANULOCYTES: 0 %
LYMPHS PCT: 22 %
Lymphs Abs: 2.3 10*3/uL (ref 0.7–4.0)
MCH: 30.5 pg (ref 26.0–34.0)
MCHC: 33.8 g/dL (ref 30.0–36.0)
MCV: 90.2 fL (ref 78.0–100.0)
MONO ABS: 0.8 10*3/uL (ref 0.1–1.0)
MONOS PCT: 7 %
NEUTROS ABS: 6.9 10*3/uL (ref 1.7–7.7)
NEUTROS PCT: 67 %
PLATELETS: 199 10*3/uL (ref 150–400)
RBC: 4.59 MIL/uL (ref 4.22–5.81)
RDW: 13.2 % (ref 11.5–15.5)
WBC: 10.2 10*3/uL (ref 4.0–10.5)

## 2018-01-31 LAB — I-STAT TROPONIN, ED: TROPONIN I, POC: 0 ng/mL (ref 0.00–0.08)

## 2018-01-31 LAB — BASIC METABOLIC PANEL
ANION GAP: 7 (ref 5–15)
BUN: 11 mg/dL (ref 8–23)
CALCIUM: 9 mg/dL (ref 8.9–10.3)
CHLORIDE: 109 mmol/L (ref 98–111)
CO2: 21 mmol/L — AB (ref 22–32)
Creatinine, Ser: 1.01 mg/dL (ref 0.61–1.24)
GFR calc non Af Amer: >60 mL/min (ref >60)
GLUCOSE: 159 mg/dL — AB (ref 70–99)
POTASSIUM: 3.7 mmol/L (ref 3.5–5.1)
Sodium: 137 mmol/L (ref 135–145)

## 2018-01-31 LAB — D-DIMER, QUANTITATIVE: D-Dimer, Quant: 0.51 ug/mL-FEU — ABNORMAL HIGH (ref 0.00–0.50)

## 2018-01-31 MED ORDER — IOPAMIDOL (ISOVUE-370) INJECTION 76%
INTRAVENOUS | Status: AC
  Filled 2018-01-31: qty 100

## 2018-01-31 MED ORDER — IOPAMIDOL (ISOVUE-370) INJECTION 76%
100.0000 mL | Freq: Once | INTRAVENOUS | Status: AC | PRN
  Administered 2018-01-31: 100 mL via INTRAVENOUS

## 2018-01-31 NOTE — ED Triage Notes (Signed)
BIB EMS for further eval of left-sided chest pain rad down left arm; was seen in ED for same x2 days ago - d/c'd to home at that time

## 2018-01-31 NOTE — ED Provider Notes (Signed)
[facility] EMERGENCY DEPARTMENT Provider Note   CSN: [id number] Arrival date & time: 01/31/18  9628     History   Chief Complaint Chief Complaint  Patient presents with  . Chest Pain    HPI Stephen Stone is a 62 y.o. male.  62 year old male with prior history of CAD/MI, prostate cancer, blindness, and hyperlipidemia presents complaining of left shoulder pain.  This pain is been present for at least the last week.  Patient was seen for same complaint on July 4.  His ED work-up at that time was without suggestion of ACS.  He was discharged home.  Patient returns today complaining of continued discomfort.  He reports that his pain does not feel like "a heart attack" - this doesn't feel like his prior MI.  He denies associated shortness of breath, diaphoresis, nausea, vomiting, or other acute complaint.  At the time of my evaluation he says that he is feeling better.  He is concerned that that radiation from his recently placed "prostate seeds" may be causing his discomfort.     The history is provided by the patient and medical records.  Chest Pain   This is a recurrent problem. The current episode started more than 2 days ago. The problem occurs constantly. The problem has not changed since onset.The pain is associated with movement. The pain is present in the lateral region. The pain is mild. The quality of the pain is described as burning. The pain does not radiate. The symptoms are aggravated by certain positions. Pertinent negatives include no abdominal pain, no nausea and no shortness of breath.    Past Medical History:  Diagnosis Date  . Anxiety   . Coronary artery disease cardioloigst-  dr Stanford Breed--- previously cardiologist from [facility]   hx anterior wall STEMI,  10-05-2011  cardiac cath w/ PCI and BM stent x1 to distal LAD  . Depression   . 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 History of ST elevation myocardial infarction  (STEMI) 10/05/2011    [place], [place]   acute anterior wall--- s/p  Cardiac cath w/ PCI and stent to distal LAD  . Hyperlipidemia   . 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 Prostate cancer [facility]) urologist-  dr wrenn/  onologist-  dr Tammi Klippel   dx Nov 2017 , Gleason 6, in [place]--  and repeat bx 08-19-2017 ,  Stage T1c, Gleason 3+4, PSA 25.20, vol 47cc-- scheduled for radioactive seed implants 12-25-2017  . S/p bare metal coronary artery stent 10-05-2011   [place], [place]   BM stent x1 to distal LAD    Patient Active Problem List   Diagnosis Date Noted  . Prostate cancer ([place]) 03/19/2017    Past Surgical History:  Procedure Laterality Date  . CARDIOVASCULAR STRESS TEST  04-23-2017   dr Stanford Breed   Low Risk Nuclear perfusion study w/ no evidence ischemia/  normal LV function and wall motion,  nuclear stress ef 58%  . CORONARY ANGIOPLASTY WITH STENT PLACEMENT  10-05-2011   [facility] in [place], [place] (records scanned in epic)   in setting anterior STEMI-- PCI and BM (Vision) stent x1 to distal LAD normal LVEF 60% with hypokinesis in the mid portion tf the anterolateral wall (30% mLAD,  30% CFx , 95% dLAD)  . CYSTOSCOPY N/A 08/19/2017   Procedure: CYSTOSCOPY;  Surgeon: [name], MD;  Location: [facility];  Service: Urology;  Laterality: N/A;  . CYSTOSCOPY  12/25/2017   Procedure: CYSTOSCOPY;  Surgeon: [name], MD;  Location: [facility];  Service: Urology;;  no seeds noted in bladder  . PROSTATE BIOPSY N/A 08/19/2017   Procedure: BIOPSY TRANSRECTAL ULTRASONIC PROSTATE (TUBP);  Surgeon: [name], MD;  Location: [facility];  Service: Urology;  Laterality: N/A;  . RADIOACTIVE SEED IMPLANT N/A 12/25/2017   Procedure: RADIOACTIVE SEED IMPLANT/BRACHYTHERAPY IMPLANT;  Surgeon: [name], MD;  Location: [facility];  Service: Urology;  Laterality: N/A;  77 seeds  implanted  . SPACE OAR INSTILLATION N/A 12/25/2017   Procedure: SPACE OAR INSTILLATION;  Surgeon: [name], MD;  Location: [facility];  Service: Urology;  Laterality: N/A;  . TRANSTHORACIC ECHOCARDIOGRAM  03-21-2016    Dr Ronal [facility])   mild concentric LVH, ef 52-84%, grade 1 diastolic dysfunction/ mild PR/ trace TR        Home Medications    Prior to Admission medications   Medication Sig Start Date End Date Taking? Authorizing Provider  acetaminophen (TYLENOL) 650 MG CR tablet Take 1,300 mg by mouth every 8 (eight) hours as needed for pain.   Yes [provider]  aspirin EC 81 MG tablet Take 81 mg by mouth daily.   Yes [provider]  atorvastatin (LIPITOR) 80 MG tablet Take 1 tablet (80 mg total) by mouth every evening. 03/13/17  Yes [name], PA-C  lisinopril (PRINIVIL,ZESTRIL) 20 MG tablet Take 1 tablet (20 mg total) by mouth daily. Patient taking differently: Take 20 mg by mouth every morning.  03/13/17  Yes [name], PA-C  metoprolol succinate (TOPROL-XL) 100 MG 24 hr tablet Take 1 tablet (100 mg total) by mouth daily. Take with or immediately following a meal. Patient taking differently: Take 100 mg by mouth daily.  03/13/17  Yes [name], PA-C  QUEtiapine (SEROQUEL) 200 MG tablet Take 1 tablet (200 mg total) by mouth at bedtime. 03/13/17  Yes [name], PA-C  aspirin 325 MG EC tablet Take 1 tablet (325 mg total) by mouth daily. Patient not taking: Reported on 01/31/2018 03/13/17   [name], PA-C  traMADol (ULTRAM) 50 MG tablet Take 1 tablet (50 mg total) by mouth every 6 (six) hours as needed. Patient not taking: Reported on 01/31/2018 12/25/17 12/25/18  [name], MD    Family History Family History  Problem Relation Age of Onset  . Cancer Mother        pancreatic  . CAD Father   . Cancer Brother        prostate  . Cancer Brother        prostate    Social History Social History    Tobacco Use  . Smoking status: Current Some Day Smoker    Packs/day: 0.25    Years: 6.00    Pack years: 1.50    Types: Cigarettes  . Smokeless tobacco: Never Used  Substance Use Topics  . Alcohol use: No  . Drug use: Yes    Types: Marijuana    Comment: can't remember last time     Allergies   Patient has no known allergies.   Review of Systems Review of Systems  Respiratory: Negative for shortness of breath.   Cardiovascular: Positive for chest pain.  Gastrointestinal: Negative for abdominal pain and nausea.  All other systems reviewed and are negative.    Physical Exam Updated Vital  Signs BP 137/86 (BP Location: Left Arm)   Pulse 63   Temp 98.3 F (36.8 C) (Oral)   Resp 11   SpO2 98%   Physical Exam  Constitutional: He is oriented to person, place, and time. He appears well-developed and well-nourished. No distress.  HENT:  Head: Normocephalic and atraumatic.  Mouth/Throat: Oropharynx is clear and moist.  Eyes: Pupils are equal, round, and reactive to light. Conjunctivae and EOM are normal.  Neck: Normal range of motion. Neck supple.  Cardiovascular: Normal rate, regular rhythm and normal heart sounds.  No murmur heard. Pulmonary/Chest: Effort normal and breath sounds normal. No respiratory distress.  Abdominal: Soft. He exhibits no distension. There is no tenderness.  Musculoskeletal: Normal range of motion. He exhibits no edema or deformity.  Neurological: He is alert and oriented to person, place, and time.  Skin: Skin is warm and dry.  Psychiatric: He has a normal mood and affect.  Nursing note and vitals reviewed.    ED Treatments / Results  Labs (all labs ordered are listed, but only abnormal results are displayed) Labs Reviewed  BASIC METABOLIC PANEL - Abnormal; Notable for the following components:      Result Value   CO2 21 (*)    Glucose, Bld 159 (*)    All other components within normal limits  D-DIMER, QUANTITATIVE (NOT AT [facility]) -  Abnormal; Notable for the following components:   D-Dimer, Quant 0.51 (*)    All other components within normal limits  CBC WITH DIFFERENTIAL/PLATELET  I-STAT TROPONIN, ED    EKG EKG Interpretation  Date/Time:  Saturday January 31 2018 09:44:16 EDT Ventricular Rate:  63 PR Interval:    QRS Duration: 92 QT Interval:  396 QTC Calculation: 406 R Axis:   -10 Text Interpretation:  Sinus rhythm Abnormal R-wave progression, early transition Minimal ST elevation, anterior leads Confirmed by Dene Gentry [phone]) on 01/31/2018 10:30:07 AM   Radiology Ct Angio Chest Pe W/cm &/or Wo Cm  Result Date: 01/31/2018 CLINICAL DATA:  Chest pain EXAM: CT ANGIOGRAPHY CHEST WITH CONTRAST TECHNIQUE: Multidetector CT imaging of the chest was performed using the standard protocol during bolus administration of intravenous contrast. Multiplanar CT image reconstructions and MIPs were obtained to evaluate the vascular anatomy. CONTRAST:  130mL ISOVUE-370 IOPAMIDOL (ISOVUE-370) INJECTION 76% COMPARISON:  January 29, 2018 FINDINGS: Cardiovascular: There is no demonstrable pulmonary embolus. There is no thoracic aortic aneurysm. No dissection evident. It should be noted that the contrast bolus in the aorta is less than optimal for potential dissection assessment. Visualized great vessels appear unremarkable. Note that the left common and right innominate arteries arise as a common trunk, an anatomic variant. There is no pericardial effusion or pericardial thickening. There are foci of coronary artery calcification evident. Mediastinum/Nodes: Visualized thyroid appears normal. There are subcentimeter mediastinal lymph nodes. There is no adenopathy by size criteria. There are occasional foci lymph node calcification consistent with prior granulomatous disease. No esophageal lesions are evident. Lungs/Pleura: There is no frank edema or consolidation. There is mild mosaic attenuation in the lungs suggesting atelectasis with probable  small airways obstructive disease. No appreciable pleural effusion or pleural thickening evident. Upper Abdomen: There is a small calcified granuloma in the spleen. Visualized upper abdominal structures otherwise appear unremarkable. Musculoskeletal: There are no blastic or lytic bone lesions. No chest wall lesions are evident. Review of the MIP images confirms the above findings. IMPRESSION: 1. No demonstrable pulmonary embolus. No thoracic aortic aneurysm or dissection. Note that the contrast bolus in  the aorta is less than optimal for assessment for potential dissection. Foci of coronary artery calcification are noted. 2. No frank edema or consolidation. Areas of mosaic attenuation likely represent atelectasis and a degree of small airways obstructive disease. 3.  No thoracic adenopathy. 4.  Evidence of prior granulomatous disease. Electronically Signed   By: Lowella Grip III M.D.   On: 01/31/2018 14:29    Procedures Procedures (including critical care time)  Medications Ordered in ED Medications  iopamidol (ISOVUE-370) 76 % injection (has no administration in time range)  iopamidol (ISOVUE-370) 76 % injection 100 mL (100 mLs Intravenous Contrast Given 01/31/18 1315)     Initial Impression / Assessment and Plan / ED Course  I have reviewed the triage vital signs and the nursing notes.  Pertinent labs & imaging results that were available during my care of the patient were reviewed by me and considered in my medical decision making (see chart for details).     MDM  Screen complete  Patient is presenting for evaluation of his atypical chest pain.  EKG is without significant change and without suggestion of ACS.  Troponin x1 is 0.  Patient with serial troponins 48 hours previously that were also 0.  CTA was obtained and did not show PE or other significant pathology. Other screening labs are without significant abnormality.   After period of observation patient feels improved.  Patient  declines admission for further work-up and/or observation.  He desires discharge home.  Close follow-up is advised.  Strict return precautions are given and understood.  Final Clinical Impressions(s) / ED Diagnoses   Final diagnoses:  Atypical chest pain    ED Discharge Orders    None       Valarie Merino, MD 01/31/18 [phone]

## 2018-01-31 NOTE — Discharge Instructions (Addendum)
Please return for any problem. Follow up with your regular physician in 2-3 days.  °

## 2018-02-02 ENCOUNTER — Ambulatory Visit: Payer: Medicare Other

## 2018-02-02 ENCOUNTER — Telehealth: Payer: Self-pay | Admitting: Radiation Oncology

## 2018-02-02 ENCOUNTER — Ambulatory Visit
Admission: RE | Admit: 2018-02-02 | Discharge: 2018-02-02 | Disposition: A | Discharge status: Home / Self Care | Payer: Medicare Other | Source: Ambulatory Visit | Attending: Radiation Oncology | Admitting: Radiation Oncology

## 2018-02-02 DIAGNOSIS — Z510 Encounter for antineoplastic radiation therapy: Secondary | ICD-10-CM | POA: Diagnosis not present

## 2018-02-02 NOTE — Telephone Encounter (Signed)
Returned the call of [place] @ [facility]. Confirmed upcoming treatment appointments with her for this patient. She confirms that [facility] [facility] will bring the patient to his appointments now that they have been confirmed.

## 2018-02-03 ENCOUNTER — Ambulatory Visit: Payer: Medicare Other

## 2018-02-03 ENCOUNTER — Ambulatory Visit
Admission: RE | Admit: 2018-02-03 | Discharge: 2018-02-03 | Disposition: A | Discharge status: Home / Self Care | Payer: Medicare Other | Source: Ambulatory Visit | Attending: Radiation Oncology | Admitting: Radiation Oncology

## 2018-02-03 DIAGNOSIS — Z510 Encounter for antineoplastic radiation therapy: Secondary | ICD-10-CM | POA: Diagnosis not present

## 2018-02-04 ENCOUNTER — Other Ambulatory Visit: Payer: Self-pay

## 2018-02-04 ENCOUNTER — Ambulatory Visit
Admission: RE | Admit: 2018-02-04 | Discharge: 2018-02-04 | Disposition: A | Discharge status: Home / Self Care | Payer: Medicare Other | Source: Ambulatory Visit | Attending: Radiation Oncology | Admitting: Radiation Oncology

## 2018-02-04 ENCOUNTER — Ambulatory Visit: Payer: Medicare Other

## 2018-02-04 ENCOUNTER — Emergency Department (HOSPITAL_COMMUNITY)
Admission: EM | Admit: 2018-02-04 | Discharge: 2018-02-04 | Disposition: A | Discharge status: Home / Self Care | Payer: Medicare Other | Attending: Emergency Medicine | Admitting: Emergency Medicine

## 2018-02-04 ENCOUNTER — Encounter (HOSPITAL_COMMUNITY): Payer: Self-pay | Admitting: *Deleted

## 2018-02-04 DIAGNOSIS — Z79899 Other long term (current) drug therapy: Secondary | ICD-10-CM | POA: Diagnosis not present

## 2018-02-04 DIAGNOSIS — R072 Precordial pain: Secondary | ICD-10-CM | POA: Diagnosis present

## 2018-02-04 DIAGNOSIS — R0789 Other chest pain: Secondary | ICD-10-CM | POA: Insufficient documentation

## 2018-02-04 DIAGNOSIS — I252 Old myocardial infarction: Secondary | ICD-10-CM | POA: Diagnosis not present

## 2018-02-04 DIAGNOSIS — F17210 Nicotine dependence, cigarettes, uncomplicated: Secondary | ICD-10-CM | POA: Diagnosis not present

## 2018-02-04 DIAGNOSIS — I2510 Atherosclerotic heart disease of native coronary artery without angina pectoris: Secondary | ICD-10-CM | POA: Diagnosis not present

## 2018-02-04 DIAGNOSIS — Z510 Encounter for antineoplastic radiation therapy: Secondary | ICD-10-CM | POA: Diagnosis not present

## 2018-02-04 DIAGNOSIS — Z7982 Long term (current) use of aspirin: Secondary | ICD-10-CM | POA: Insufficient documentation

## 2018-02-04 LAB — CBC
HCT: 41.7 % (ref 39.0–52.0)
HEMOGLOBIN: 13.8 g/dL (ref 13.0–17.0)
MCH: 29.8 pg (ref 26.0–34.0)
MCHC: 33.1 g/dL (ref 30.0–36.0)
MCV: 90.1 fL (ref 78.0–100.0)
PLATELETS: 202 10*3/uL (ref 150–400)
RBC: 4.63 MIL/uL (ref 4.22–5.81)
RDW: 13.2 % (ref 11.5–15.5)
WBC: 8.5 10*3/uL (ref 4.0–10.5)

## 2018-02-04 LAB — BASIC METABOLIC PANEL
Anion gap: 7 (ref 5–15)
BUN: 12 mg/dL (ref 8–23)
CO2: 22 mmol/L (ref 22–32)
CREATININE: 1.04 mg/dL (ref 0.61–1.24)
Calcium: 8.9 mg/dL (ref 8.9–10.3)
Chloride: 111 mmol/L (ref 98–111)
GFR calc non Af Amer: >60 mL/min (ref >60)
Glucose, Bld: 154 mg/dL — ABNORMAL HIGH (ref 70–99)
POTASSIUM: 4.1 mmol/L (ref 3.5–5.1)
SODIUM: 140 mmol/L (ref 135–145)

## 2018-02-04 LAB — I-STAT TROPONIN, ED
TROPONIN I, POC: 0.01 ng/mL (ref 0.00–0.08)
Troponin i, poc: 0.03 ng/mL (ref 0.00–0.08)

## 2018-02-04 LAB — TROPONIN I

## 2018-02-04 MED ORDER — RANITIDINE HCL 150 MG PO TABS: 150.0000 mg | ORAL_TABLET | Freq: Two times a day (BID) | ORAL | 0 refills | Status: DC

## 2018-02-04 MED ORDER — FAMOTIDINE 20 MG PO TABS
40.0000 mg | ORAL_TABLET | Freq: Once | ORAL | Status: AC
  Administered 2018-02-04: 40 mg via ORAL
  Filled 2018-02-04: qty 2

## 2018-02-04 NOTE — ED Notes (Signed)
Pt reports that 7/3 he was laying in bed and he had sudden onset of cheat pain.  Called 911 and FD found him to be HTN, he was seen at ED and d/c.  Saturday am he had another episode, pt was elevated, he was seen in ED and all tests were done again and all tests were negative.  Pt states that this occurred again.  Pt states that he is here to be admitted this time as he wants to know exactly what is causing his CP.  Pt is not in any acute distress at this time, ambulatory on his insistence.

## 2018-02-04 NOTE — ED Notes (Signed)
Patient verbalizes understanding of discharge instructions. Opportunity for questioning and answers were provided. Armband removed by staff, pt discharged from ED ambulatory.   

## 2018-02-04 NOTE — Progress Notes (Signed)
Pt came from radiation treatment area stating that he has some chest pain. Pt states that 2 days before the 4th of July. Pt states that on the 4th of July he went to the ER for chest pain. Pt states that his chest pain was a 10/10 on exertion bur when he is sitting he feels great. Pt states that the pain was bad last night and he took 2 800mg  of Ibuprofen. Pt states that when he does feel the chest pain it is on the left side of his chest that radiates to his left arm. Pt states that when this happens he feels numbness and tingling. Pt states that the chest pain feels sharp. Pt states that he took his blood pressure medication at 6 am this morning. Pt states that he does have an upcoming appointment to see his primary care physician on the 31st of July. Pt states that he has tried taking Gaviscon in case he had gas. Pt states that he drinks 1 cup of coffee daily and sometimes soda. Pt states that he gave up smoking and has not smoked in 2 weeks. Pt stated that he did not want to go to the ER and will call his PCP to see if he can get an earlier appointment. Pt initial B/P was 169/105 sitting and on recheck B/P was 134/90, Pulse -70 and O2 was 100%.

## 2018-02-04 NOTE — ED Provider Notes (Signed)
Patient placed in Quick Look pathway, seen and evaluated   Chief Complaint: Chest pain  HPI:   62 year old male with CAD, prostate cancer, blindness  presents with chest pain. He states he's been seen in the ED 3 times for this and keeps being told it's not his heart. The pain is a grabbing pain. It does not feel like prior MI. He states that he will call EMS and his blood pressure will be checked and it's very high (>111 systolic) and as soon as he gets to the ED his chest pain will subside and his blood pressure will become normal. This makes him question whether or not it's anxiety. He is asking if there is a "test for anxiety".   ROS: +chest pain  Physical Exam:   Gen: No distress. Blind. Calm and pleasant  Neuro: Awake and Alert  Skin: Warm    Focused Exam: Heart: Regular rate and rhythm      Lungs: CTA   Initiation of care has begun. The patient has been counseled on the process, plan, and necessity for staying for the completion/evaluation, and the remainder of the medical screening examination    Recardo Evangelist, PA-C 02/04/18 [place], Nathan, MD 02/04/18 1714

## 2018-02-04 NOTE — ED Triage Notes (Signed)
Pt here for re-evaluation of CP today.  Pt was seen twice before for the same symptoms (last time was 7/6).  Pt reports left sided CP with radiation to left arm which began at 1330 today.  Pt is undergoing radiation therapy for prostate CA.  Pt has radiation pellets. Pt took 324mg  asa pta and is pain free.  Pt has 20g in left hand by EMS

## 2018-02-04 NOTE — Discharge Instructions (Signed)
Please follow up with your PCP, they may need to send you to a gastroenterologist.

## 2018-02-04 NOTE — ED Notes (Signed)
Pt states he feels upper left sided chest pain when his BP is elevated. Pt had a 3 minute episode of CP at 1709. pts BP was 136/100 at time of CP. When pts CP went away his BP at 1715 was 126/85

## 2018-02-04 NOTE — ED Provider Notes (Signed)
[place] EMERGENCY DEPARTMENT Provider Note   CSN: [id number] Arrival date & time: 02/04/18  1436     History   Chief Complaint Chief Complaint  Patient presents with  . Chest Pain    HPI Stephen Stone is a 62 y.o. male.  2 yo M with a cc of chest pain.  Going on for the past couple weeks off and on.  This is his third visit to the ED for the same.  Feels that it grabs him in the chest.  Scribed sharp and severe last for about 10 minutes and then resolved.  Nothing seems to make it better or worse.  Has happened after eating a couple times.  He denies nausea vomiting denies shortness of breath denies diaphoresis.  Denies hemoptysis denies cough congestion or fever.  The history is provided by the patient.  Chest Pain   This is a new problem. The current episode started 2 days ago. The problem occurs constantly. The problem has not changed since onset.The pain is present in the substernal region. The pain is at a severity of 4/10. The pain is mild. The quality of the pain is described as brief and sharp. The pain radiates to the left arm. Duration of episode(s) is 2 days. Pertinent negatives include no abdominal pain, no fever, no headaches, no palpitations, no shortness of breath and no vomiting. He has tried nothing for the symptoms. The treatment provided no relief. Risk factors include smoking/tobacco exposure.  His past medical history is significant for MI.    Past Medical History:  Diagnosis Date  . Anxiety   . Coronary artery disease cardioloigst-  dr Stanford Breed--- previously cardiologist from [facility]   hx anterior wall STEMI,  10-05-2011  cardiac cath w/ PCI and BM stent x1 to distal LAD  . Depression   . 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 History of ST elevation myocardial infarction (STEMI) 10/05/2011    [place], [place]   acute anterior wall--- s/p  Cardiac cath w/ PCI and stent to distal LAD  . Hyperlipidemia   .  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 Prostate cancer [facility]) urologist-  dr wrenn/  onologist-  dr Tammi Klippel   dx Nov 2017 , Gleason 6, in [place]--  and repeat bx 08-19-2017 ,  Stage T1c, Gleason 3+4, PSA 25.20, vol 47cc-- scheduled for radioactive seed implants 12-25-2017  . S/p bare metal coronary artery stent 10-05-2011   [place], [place]   BM stent x1 to distal LAD    Patient Active Problem List   Diagnosis Date Noted  . Prostate cancer ([place]) 03/19/2017    Past Surgical History:  Procedure Laterality Date  . CARDIOVASCULAR STRESS TEST  04-23-2017   dr Stanford Breed   Low Risk Nuclear perfusion study w/ no evidence ischemia/  normal LV function and wall motion,  nuclear stress ef 58%  . CORONARY ANGIOPLASTY WITH STENT PLACEMENT  10-05-2011   [facility] in [place], [place] (records scanned in epic)   in setting anterior STEMI-- PCI and BM (Vision) stent x1 to distal LAD normal LVEF 60% with hypokinesis in the mid portion tf the anterolateral wall (30% mLAD,  30% CFx , 95% dLAD)  . CYSTOSCOPY N/A 08/19/2017   Procedure: CYSTOSCOPY;  Surgeon: [name], MD;  Location: [facility];  Service: Urology;  Laterality: N/A;  . CYSTOSCOPY  12/25/2017  Procedure: CYSTOSCOPY;  Surgeon: [name], MD;  Location: [facility];  Service: Urology;;  no seeds noted in bladder  . PROSTATE BIOPSY N/A 08/19/2017   Procedure: BIOPSY TRANSRECTAL ULTRASONIC PROSTATE (TUBP);  Surgeon: [name], MD;  Location: [facility];  Service: Urology;  Laterality: N/A;  . RADIOACTIVE SEED IMPLANT N/A 12/25/2017   Procedure: RADIOACTIVE SEED IMPLANT/BRACHYTHERAPY IMPLANT;  Surgeon: [name], MD;  Location[facility];  Service: Urology;  Laterality: N/A;  77 seeds implanted  . SPACE OAR INSTILLATION N/A 12/25/2017   Procedure: SPACE OAR INSTILLATION;  Surgeon: [name], MD;  Location: [facility];  Service: Urology;  Laterality: N/A;  . TRANSTHORACIC ECHOCARDIOGRAM  03-21-2016    Dr Ronal Fear [facility])   mild concentric LVH, ef 65-78%, grade 1 diastolic dysfunction/ mild PR/ trace TR        Home Medications    Prior to Admission medications   Medication Sig Start Date End Date Taking? Authorizing Provider  acetaminophen (TYLENOL) 650 MG CR tablet Take 1,300 mg by mouth every 8 (eight) hours as needed for pain.    [provider]  aspirin 325 MG EC tablet Take 1 tablet (325 mg total) by mouth daily. Patient not taking: Reported on 01/31/2018 03/13/17   [name], PA-C  aspirin EC 81 MG tablet Take 81 mg by mouth daily.    [provider]  atorvastatin (LIPITOR) 80 MG tablet Take 1 tablet (80 mg total) by mouth every evening. 03/13/17   [name], PA-C  lisinopril (PRINIVIL,ZESTRIL) 20 MG tablet Take 1 tablet (20 mg total) by mouth daily. Patient taking differently: Take 20 mg by mouth every morning.  03/13/17   [name], PA-C  metoprolol succinate (TOPROL-XL) 100 MG 24 hr tablet Take 1 tablet (100 mg total) by mouth daily. Take with or immediately following a meal. Patient taking differently: Take 100 mg by mouth daily.  03/13/17   [name], PA-C  QUEtiapine (SEROQUEL) 200 MG tablet Take 1 tablet (200 mg total) by mouth at bedtime. 03/13/17   [name], PA-C  ranitidine (ZANTAC) 150 MG tablet Take 1 tablet (150 mg total) by mouth 2 (two) times daily. 02/04/18   Deno Etienne, DO  traMADol (ULTRAM) 50 MG tablet Take 1 tablet (50 mg total) by mouth every 6 (six) hours as needed. Patient not taking: Reported on 01/31/2018 12/25/17 12/25/18  [name], MD    Family History Family History  Problem Relation Age of Onset  . Cancer Mother        pancreatic  . CAD Father   . Cancer Brother        prostate  . Cancer Brother        prostate    Social History Social History   Tobacco Use  . Smoking status:  Current Some Day Smoker    Packs/day: 0.25    Years: 6.00    Pack years: 1.50    Types: Cigarettes  . Smokeless tobacco: Never Used  Substance Use Topics  . Alcohol use: No  . Drug use: Yes    Types: Marijuana    Comment: can't remember last time     Allergies   Patient has no known allergies.   Review of Systems Review of Systems  Constitutional: Negative for chills and fever.  HENT: Negative for congestion and facial swelling.   Eyes: Negative for discharge and visual disturbance.  Respiratory: Negative for  shortness of breath.   Cardiovascular: Positive for chest pain. Negative for palpitations.  Gastrointestinal: Negative for abdominal pain, diarrhea and vomiting.  Musculoskeletal: Negative for arthralgias and myalgias.  Skin: Negative for color change and rash.  Neurological: Negative for tremors, syncope and headaches.  Psychiatric/Behavioral: Negative for confusion and dysphoric mood.     Physical Exam Updated Vital Signs BP 128/89   Pulse 65   Temp 97.6 F (36.4 C) (Oral)   Resp 18   Ht 6' (1.829 m)   Wt 102.5 kg (226 lb)   SpO2 100%   BMI 30.65 kg/m   Physical Exam  Constitutional: He is oriented to person, place, and time. He appears well-developed and well-nourished.  HENT:  Head: Normocephalic and atraumatic.  Eyes:  Clinically blind Opacity to right eye.  Neck: Normal range of motion. Neck supple. No JVD present.  Cardiovascular: Normal rate and regular rhythm. Exam reveals no gallop and no friction rub.  No murmur heard. Pulses:      Radial pulses are 2+ on the right side, and 2+ on the left side.  Pulmonary/Chest: No respiratory distress. He has no wheezes.  Abdominal: He exhibits no distension. There is no rebound and no guarding.  Musculoskeletal: Normal range of motion.  Neurological: He is alert and oriented to person, place, and time.  Skin: No rash noted. No pallor.  Psychiatric: He has a normal mood and affect. His behavior is  normal.  Nursing note and vitals reviewed.    ED Treatments / Results  Labs (all labs ordered are listed, but only abnormal results are displayed) Labs Reviewed  BASIC METABOLIC PANEL - Abnormal; Notable for the following components:      Result Value   Glucose, Bld 154 (*)    All other components within normal limits  CBC  TROPONIN I  I-STAT TROPONIN, ED  I-STAT TROPONIN, ED    EKG EKG Interpretation  Date/Time:  Wednesday February 04 2018 14:48:34 EDT Ventricular Rate:  75 PR Interval:    QRS Duration: 86 QT Interval:  394 QTC Calculation: 439 R Axis:   48 Text Interpretation:  p waves are difficult to find with background noise, likely nsr Abnormal ECG Otherwise no significant change Confirmed by Deno Etienne [phone]) on 02/04/2018 4:55:34 PM   Radiology No results found.  Procedures Procedures (including critical care time)  Medications Ordered in ED Medications  famotidine (PEPCID) tablet 40 mg (40 mg Oral Given 02/04/18 1826)     Initial Impression / Assessment and Plan / ED Course  I have reviewed the triage vital signs and the nursing notes.  Pertinent labs & imaging results that were available during my care of the patient were reviewed by me and considered in my medical decision making (see chart for details).  Clinical Course as of Feb 05 2220  Wed Feb 04, 2018  2014 Second troponin is negative however it is elevated from the first 1.  Will send lab troponin to confirm.   [DF]    Clinical Course User Index [DF] Deno Etienne, DO    62 yo M with a chief complaint of chest pain.  This is atypical and this is his third visit for the same.  He had a CT angiogram of the chest and his last visit was negative for PE.  He has multiple troponins that are all negative.  Delta troponin was performed here with some mild rise a third troponin was checked and was negative.  We will have the patient  follow-up with his PCP.  I discussed with him at length about multiple  possible other diagnoses we will attempt to treat if this is reflux.  Trial of Zantac.  10:21 PM:  I have discussed the diagnosis/risks/treatment options with the patient and believe the pt to be eligible for discharge home to follow-up with PCP. We also discussed returning to the ED immediately if new or worsening sx occur. We discussed the sx which are most concerning (e.g., sudden worsening pain, fever, inability to tolerate by mouth) that necessitate immediate return. Medications administered to the patient during their visit and any new prescriptions provided to the patient are listed below.  Medications given during this visit Medications  famotidine (PEPCID) tablet 40 mg (40 mg Oral Given 02/04/18 1826)      The patient appears reasonably screen and/or stabilized for discharge and I doubt any other medical condition or other [facility] requiring further screening, evaluation, or treatment in the ED at this time prior to discharge.    Final Clinical Impressions(s) / ED Diagnoses   Final diagnoses:  Atypical chest pain    ED Discharge Orders        Ordered    ranitidine (ZANTAC) 150 MG tablet  2 times daily     02/04/18 2106       Deno Etienne, DO 02/04/18 2221

## 2018-02-05 ENCOUNTER — Ambulatory Visit: Payer: Medicare Other

## 2018-02-05 ENCOUNTER — Ambulatory Visit
Admission: RE | Admit: 2018-02-05 | Discharge: 2018-02-05 | Disposition: A | Discharge status: Home / Self Care | Payer: Medicare Other | Source: Ambulatory Visit | Attending: Radiation Oncology | Admitting: Radiation Oncology

## 2018-02-05 DIAGNOSIS — Z510 Encounter for antineoplastic radiation therapy: Secondary | ICD-10-CM | POA: Diagnosis not present

## 2018-02-06 ENCOUNTER — Ambulatory Visit: Payer: Medicare Other

## 2018-02-09 ENCOUNTER — Ambulatory Visit: Payer: Medicare Other

## 2018-02-09 ENCOUNTER — Ambulatory Visit
Admission: RE | Admit: 2018-02-09 | Discharge: 2018-02-09 | Disposition: A | Discharge status: Home / Self Care | Payer: Medicare Other | Source: Ambulatory Visit | Attending: Radiation Oncology | Admitting: Radiation Oncology

## 2018-02-09 ENCOUNTER — Encounter: Payer: Self-pay | Admitting: Radiation Oncology

## 2018-02-09 DIAGNOSIS — I214 Non-ST elevation (NSTEMI) myocardial infarction: Secondary | ICD-10-CM

## 2018-02-09 HISTORY — DX: Non-ST elevation (NSTEMI) myocardial infarction: I21.4

## 2018-02-10 ENCOUNTER — Inpatient Hospital Stay (HOSPITAL_COMMUNITY)
Admission: EM | Admit: 2018-02-10 | Discharge: 2018-02-11 | DRG: 247 | Disposition: A | Discharge status: Home / Self Care | Payer: Medicare Other | Attending: Interventional Cardiology | Admitting: Interventional Cardiology

## 2018-02-10 ENCOUNTER — Encounter (HOSPITAL_COMMUNITY)
Admission: EM | Disposition: A | Discharge status: Home / Self Care | Payer: Self-pay | Source: Home / Self Care | Attending: Internal Medicine

## 2018-02-10 ENCOUNTER — Encounter (HOSPITAL_COMMUNITY): Payer: Self-pay

## 2018-02-10 ENCOUNTER — Ambulatory Visit: Payer: Medicare Other

## 2018-02-10 ENCOUNTER — Emergency Department (HOSPITAL_COMMUNITY): Payer: Medicare Other

## 2018-02-10 ENCOUNTER — Inpatient Hospital Stay (HOSPITAL_COMMUNITY): Payer: Medicare Other

## 2018-02-10 ENCOUNTER — Other Ambulatory Visit: Payer: Self-pay

## 2018-02-10 DIAGNOSIS — F419 Anxiety disorder, unspecified: Secondary | ICD-10-CM | POA: Diagnosis present

## 2018-02-10 DIAGNOSIS — H409 Unspecified glaucoma: Secondary | ICD-10-CM | POA: Diagnosis present

## 2018-02-10 DIAGNOSIS — E785 Hyperlipidemia, unspecified: Secondary | ICD-10-CM | POA: Diagnosis present

## 2018-02-10 DIAGNOSIS — Z8249 Family history of ischemic heart disease and other diseases of the circulatory system: Secondary | ICD-10-CM | POA: Diagnosis not present

## 2018-02-10 DIAGNOSIS — I2510 Atherosclerotic heart disease of native coronary artery without angina pectoris: Secondary | ICD-10-CM | POA: Diagnosis not present

## 2018-02-10 DIAGNOSIS — Z800 Family history of malignant neoplasm of digestive organs: Secondary | ICD-10-CM | POA: Diagnosis not present

## 2018-02-10 DIAGNOSIS — I10 Essential (primary) hypertension: Secondary | ICD-10-CM | POA: Diagnosis present

## 2018-02-10 DIAGNOSIS — Z7982 Long term (current) use of aspirin: Secondary | ICD-10-CM

## 2018-02-10 DIAGNOSIS — I214 Non-ST elevation (NSTEMI) myocardial infarction: Principal | ICD-10-CM | POA: Diagnosis present

## 2018-02-10 DIAGNOSIS — H547 Unspecified visual loss: Secondary | ICD-10-CM | POA: Diagnosis not present

## 2018-02-10 DIAGNOSIS — H548 Legal blindness, as defined in USA: Secondary | ICD-10-CM | POA: Diagnosis present

## 2018-02-10 DIAGNOSIS — Z8042 Family history of malignant neoplasm of prostate: Secondary | ICD-10-CM | POA: Diagnosis not present

## 2018-02-10 DIAGNOSIS — I77810 Thoracic aortic ectasia: Secondary | ICD-10-CM | POA: Diagnosis present

## 2018-02-10 DIAGNOSIS — F17210 Nicotine dependence, cigarettes, uncomplicated: Secondary | ICD-10-CM | POA: Diagnosis present

## 2018-02-10 DIAGNOSIS — I25119 Atherosclerotic heart disease of native coronary artery with unspecified angina pectoris: Secondary | ICD-10-CM | POA: Diagnosis present

## 2018-02-10 DIAGNOSIS — F329 Major depressive disorder, single episode, unspecified: Secondary | ICD-10-CM | POA: Diagnosis present

## 2018-02-10 DIAGNOSIS — I25709 Atherosclerosis of coronary artery bypass graft(s), unspecified, with unspecified angina pectoris: Secondary | ICD-10-CM | POA: Diagnosis present

## 2018-02-10 DIAGNOSIS — I252 Old myocardial infarction: Secondary | ICD-10-CM | POA: Diagnosis not present

## 2018-02-10 DIAGNOSIS — Z79899 Other long term (current) drug therapy: Secondary | ICD-10-CM

## 2018-02-10 DIAGNOSIS — C61 Malignant neoplasm of prostate: Secondary | ICD-10-CM | POA: Diagnosis present

## 2018-02-10 DIAGNOSIS — E782 Mixed hyperlipidemia: Secondary | ICD-10-CM | POA: Diagnosis not present

## 2018-02-10 DIAGNOSIS — Z955 Presence of coronary angioplasty implant and graft: Secondary | ICD-10-CM | POA: Diagnosis not present

## 2018-02-10 HISTORY — DX: Essential (primary) hypertension: I10

## 2018-02-10 HISTORY — PX: CORONARY STENT INTERVENTION: CATH118234

## 2018-02-10 HISTORY — DX: Non-ST elevation (NSTEMI) myocardial infarction: I21.4

## 2018-02-10 HISTORY — PX: LEFT HEART CATH AND CORONARY ANGIOGRAPHY: CATH118249

## 2018-02-10 HISTORY — DX: ST elevation (STEMI) myocardial infarction of unspecified site: I21.3

## 2018-02-10 LAB — CBC
HCT: 39.9 % (ref 39.0–52.0)
Hemoglobin: 13.6 g/dL (ref 13.0–17.0)
MCH: 30.6 pg (ref 26.0–34.0)
MCHC: 34.1 g/dL (ref 30.0–36.0)
MCV: 89.7 fL (ref 78.0–100.0)
PLATELETS: 184 10*3/uL (ref 150–400)
RBC: 4.45 MIL/uL (ref 4.22–5.81)
RDW: 13.1 % (ref 11.5–15.5)
WBC: 9.5 10*3/uL (ref 4.0–10.5)

## 2018-02-10 LAB — BASIC METABOLIC PANEL
Anion gap: 9 (ref 5–15)
BUN: 12 mg/dL (ref 8–23)
CO2: 22 mmol/L (ref 22–32)
CREATININE: 0.98 mg/dL (ref 0.61–1.24)
Calcium: 8.7 mg/dL — ABNORMAL LOW (ref 8.9–10.3)
Chloride: 106 mmol/L (ref 98–111)
GFR calc Af Amer: >60 mL/min (ref >60)
GFR calc non Af Amer: >60 mL/min (ref >60)
GLUCOSE: 217 mg/dL — AB (ref 70–99)
Potassium: 4 mmol/L (ref 3.5–5.1)
Sodium: 137 mmol/L (ref 135–145)

## 2018-02-10 LAB — TROPONIN I
TROPONIN I: 1.19 ng/mL — AB (ref <0.03)
Troponin I: 14.43 ng/mL (ref <0.03)
Troponin I: 8.24 ng/mL (ref <0.03)

## 2018-02-10 LAB — ECHOCARDIOGRAM COMPLETE
HEIGHTINCHES: 72 in
WEIGHTICAEL: 3600 [oz_av]

## 2018-02-10 LAB — I-STAT TROPONIN, ED: Troponin i, poc: 1.37 ng/mL (ref 0.00–0.08)

## 2018-02-10 LAB — POCT ACTIVATED CLOTTING TIME: ACTIVATED CLOTTING TIME: 290 s

## 2018-02-10 SURGERY — LEFT HEART CATH AND CORONARY ANGIOGRAPHY
Anesthesia: LOCAL

## 2018-02-10 MED ORDER — NITROGLYCERIN 0.4 MG SL SUBL: 0.4000 mg | SUBLINGUAL_TABLET | SUBLINGUAL | Status: DC | PRN

## 2018-02-10 MED ORDER — HEPARIN (PORCINE) IN NACL 100-0.45 UNIT/ML-% IJ SOLN
1400.0000 [IU]/h | INTRAMUSCULAR | Status: DC
  Administered 2018-02-10: 1400 [IU]/h via INTRAVENOUS
  Filled 2018-02-10: qty 250

## 2018-02-10 MED ORDER — HEPARIN (PORCINE) IN NACL 1000-0.9 UT/500ML-% IV SOLN
INTRAVENOUS | Status: DC | PRN
  Administered 2018-02-10 (×2): 500 mL

## 2018-02-10 MED ORDER — ONDANSETRON HCL 4 MG/2ML IJ SOLN: 4.0000 mg | Freq: Four times a day (QID) | INTRAMUSCULAR | Status: DC | PRN

## 2018-02-10 MED ORDER — IOHEXOL 350 MG/ML SOLN
INTRAVENOUS | Status: DC | PRN
  Administered 2018-02-10: 90 mL via INTRA_ARTERIAL

## 2018-02-10 MED ORDER — LIDOCAINE HCL (PF) 1 % IJ SOLN
INTRAMUSCULAR | Status: DC | PRN
  Administered 2018-02-10: 2 mL via INTRADERMAL

## 2018-02-10 MED ORDER — VERAPAMIL HCL 2.5 MG/ML IV SOLN
INTRAVENOUS | Status: AC
  Filled 2018-02-10: qty 2

## 2018-02-10 MED ORDER — HEPARIN BOLUS VIA INFUSION
4000.0000 [IU] | Freq: Once | INTRAVENOUS | Status: AC
  Administered 2018-02-10: 4000 [IU] via INTRAVENOUS
  Filled 2018-02-10: qty 4000

## 2018-02-10 MED ORDER — SODIUM CHLORIDE 0.9 % IV SOLN
INTRAVENOUS | Status: AC | PRN
  Administered 2018-02-10: 50 mL/h via INTRAVENOUS

## 2018-02-10 MED ORDER — TICAGRELOR 90 MG PO TABS
ORAL_TABLET | ORAL | Status: AC
  Filled 2018-02-10: qty 2

## 2018-02-10 MED ORDER — HYDRALAZINE HCL 20 MG/ML IJ SOLN
5.0000 mg | INTRAMUSCULAR | Status: AC | PRN
Start: 2018-02-10 — End: 2018-02-10

## 2018-02-10 MED ORDER — VERAPAMIL HCL 2.5 MG/ML IV SOLN
INTRAVENOUS | Status: DC | PRN
  Administered 2018-02-10: 10 mL via INTRA_ARTERIAL

## 2018-02-10 MED ORDER — SODIUM CHLORIDE 0.9 % IV SOLN: 250.0000 mL | INTRAVENOUS | Status: DC | PRN

## 2018-02-10 MED ORDER — HEART ATTACK BOUNCING BOOK
Freq: Once | Status: AC
  Administered 2018-02-10: 22:00:00 1
  Filled 2018-02-10: qty 1

## 2018-02-10 MED ORDER — LIDOCAINE HCL (PF) 1 % IJ SOLN
INTRAMUSCULAR | Status: AC
  Filled 2018-02-10: qty 30

## 2018-02-10 MED ORDER — TICAGRELOR 90 MG PO TABS
ORAL_TABLET | ORAL | Status: DC | PRN
  Administered 2018-02-10: 180 mg via ORAL

## 2018-02-10 MED ORDER — MORPHINE SULFATE (PF) 4 MG/ML IV SOLN
4.0000 mg | Freq: Once | INTRAVENOUS | Status: AC
  Administered 2018-02-10: 4 mg via INTRAVENOUS
  Filled 2018-02-10: qty 1

## 2018-02-10 MED ORDER — SODIUM CHLORIDE 0.9 % WEIGHT BASED INFUSION: 1.0000 mL/kg/h | INTRAVENOUS | Status: AC

## 2018-02-10 MED ORDER — NITROGLYCERIN 1 MG/10 ML FOR IR/CATH LAB
INTRA_ARTERIAL | Status: DC | PRN
  Administered 2018-02-10: 200 ug via INTRACORONARY

## 2018-02-10 MED ORDER — MIDAZOLAM HCL 2 MG/2ML IJ SOLN
INTRAMUSCULAR | Status: DC | PRN
  Administered 2018-02-10: 2 mg via INTRAVENOUS

## 2018-02-10 MED ORDER — ATORVASTATIN CALCIUM 80 MG PO TABS
80.0000 mg | ORAL_TABLET | Freq: Every evening | ORAL | Status: DC
  Administered 2018-02-10: 80 mg via ORAL
  Filled 2018-02-10: qty 1

## 2018-02-10 MED ORDER — ASPIRIN 81 MG PO CHEW
CHEWABLE_TABLET | ORAL | Status: DC | PRN
  Administered 2018-02-10: 81 mg via ORAL

## 2018-02-10 MED ORDER — ASPIRIN 81 MG PO CHEW
81.0000 mg | CHEWABLE_TABLET | Freq: Every day | ORAL | Status: DC
  Administered 2018-02-11: 81 mg via ORAL
  Filled 2018-02-10: qty 1

## 2018-02-10 MED ORDER — FENTANYL CITRATE (PF) 100 MCG/2ML IJ SOLN
INTRAMUSCULAR | Status: AC
  Filled 2018-02-10: qty 2

## 2018-02-10 MED ORDER — NITROGLYCERIN IN D5W 200-5 MCG/ML-% IV SOLN
5.0000 ug/min | Freq: Once | INTRAVENOUS | Status: AC
  Administered 2018-02-10: 5 ug/min via INTRAVENOUS
  Filled 2018-02-10: qty 250

## 2018-02-10 MED ORDER — LISINOPRIL 10 MG PO TABS
20.0000 mg | ORAL_TABLET | Freq: Every day | ORAL | Status: DC
  Administered 2018-02-10 – 2018-02-11 (×2): 20 mg via ORAL
  Filled 2018-02-10 (×2): qty 2

## 2018-02-10 MED ORDER — HEPARIN (PORCINE) IN NACL 1000-0.9 UT/500ML-% IV SOLN
INTRAVENOUS | Status: AC
  Filled 2018-02-10: qty 1000

## 2018-02-10 MED ORDER — NITROGLYCERIN 1 MG/10 ML FOR IR/CATH LAB
INTRA_ARTERIAL | Status: AC
  Filled 2018-02-10: qty 10

## 2018-02-10 MED ORDER — SODIUM CHLORIDE 0.9% FLUSH: 3.0000 mL | INTRAVENOUS | Status: DC | PRN

## 2018-02-10 MED ORDER — TICAGRELOR 90 MG PO TABS
90.0000 mg | ORAL_TABLET | Freq: Two times a day (BID) | ORAL | Status: DC
  Administered 2018-02-10 – 2018-02-11 (×2): 90 mg via ORAL
  Filled 2018-02-10 (×2): qty 1

## 2018-02-10 MED ORDER — ACETAMINOPHEN 325 MG PO TABS: 650.0000 mg | ORAL_TABLET | ORAL | Status: DC | PRN

## 2018-02-10 MED ORDER — LISINOPRIL 10 MG PO TABS: 20.0000 mg | ORAL_TABLET | Freq: Every day | ORAL | Status: DC

## 2018-02-10 MED ORDER — ASPIRIN EC 81 MG PO TBEC: 81.0000 mg | DELAYED_RELEASE_TABLET | Freq: Every day | ORAL | Status: DC

## 2018-02-10 MED ORDER — ASPIRIN 81 MG PO CHEW
CHEWABLE_TABLET | ORAL | Status: AC
  Filled 2018-02-10: qty 1

## 2018-02-10 MED ORDER — FAMOTIDINE 20 MG PO TABS
20.0000 mg | ORAL_TABLET | Freq: Every day | ORAL | Status: DC
  Administered 2018-02-10 – 2018-02-11 (×2): 20 mg via ORAL
  Filled 2018-02-10 (×2): qty 1

## 2018-02-10 MED ORDER — QUETIAPINE FUMARATE 200 MG PO TABS
200.0000 mg | ORAL_TABLET | Freq: Every day | ORAL | Status: DC
  Administered 2018-02-10: 200 mg via ORAL
  Filled 2018-02-10: qty 1

## 2018-02-10 MED ORDER — HEPARIN SODIUM (PORCINE) 1000 UNIT/ML IJ SOLN
INTRAMUSCULAR | Status: DC | PRN
  Administered 2018-02-10 (×2): 5000 [IU] via INTRAVENOUS

## 2018-02-10 MED ORDER — SODIUM CHLORIDE 0.9% FLUSH
3.0000 mL | Freq: Two times a day (BID) | INTRAVENOUS | Status: DC
  Administered 2018-02-11: 10:00:00 3 mL via INTRAVENOUS

## 2018-02-10 MED ORDER — ANGIOPLASTY BOOK
Freq: Once | Status: AC
  Administered 2018-02-10: 22:00:00
  Filled 2018-02-10: qty 1

## 2018-02-10 MED ORDER — HEPARIN SODIUM (PORCINE) 1000 UNIT/ML IJ SOLN
INTRAMUSCULAR | Status: AC
  Filled 2018-02-10: qty 1

## 2018-02-10 MED ORDER — FENTANYL CITRATE (PF) 100 MCG/2ML IJ SOLN
INTRAMUSCULAR | Status: DC | PRN
  Administered 2018-02-10: 25 ug via INTRAVENOUS

## 2018-02-10 MED ORDER — METOPROLOL SUCCINATE ER 50 MG PO TB24
100.0000 mg | ORAL_TABLET | Freq: Every day | ORAL | Status: DC
  Administered 2018-02-10 – 2018-02-11 (×2): 100 mg via ORAL
  Filled 2018-02-10: qty 2
  Filled 2018-02-10: qty 1
  Filled 2018-02-10: qty 2

## 2018-02-10 MED ORDER — MIDAZOLAM HCL 2 MG/2ML IJ SOLN
INTRAMUSCULAR | Status: AC
  Filled 2018-02-10: qty 2

## 2018-02-10 MED ORDER — ENOXAPARIN SODIUM 40 MG/0.4ML ~~LOC~~ SOLN
40.0000 mg | SUBCUTANEOUS | Status: DC
  Administered 2018-02-11: 40 mg via SUBCUTANEOUS
  Filled 2018-02-10: qty 0.4

## 2018-02-10 MED ORDER — NITROGLYCERIN 0.4 MG SL SUBL: 0.4000 mg | SUBLINGUAL_TABLET | Freq: Once | SUBLINGUAL | Status: DC

## 2018-02-10 SURGICAL SUPPLY — 19 items
BALLN SAPPHIRE 2.5X12 (BALLOONS) ×2
BALLN SAPPHIRE ~~LOC~~ 4.0X10 (BALLOONS) ×2 IMPLANT
BALLOON SAPPHIRE 2.5X12 (BALLOONS) ×1 IMPLANT
CATH 5FR JL3.5 JR4 ANG PIG MP (CATHETERS) ×2 IMPLANT
CATH LAUNCHER 5F RADR (CATHETERS) ×1 IMPLANT
CATHETER LAUNCHER 5F RADR (CATHETERS) ×2
DEVICE RAD COMP TR BAND LRG (VASCULAR PRODUCTS) ×2 IMPLANT
ELECT DEFIB PAD ADLT CADENCE (PAD) ×2 IMPLANT
GLIDESHEATH SLEND SS 6F .021 (SHEATH) ×2 IMPLANT
GUIDEWIRE INQWIRE 1.5J.035X260 (WIRE) ×1 IMPLANT
INQWIRE 1.5J .035X260CM (WIRE) ×2
KIT ENCORE 26 ADVANTAGE (KITS) ×2 IMPLANT
KIT HEART LEFT (KITS) ×2 IMPLANT
KIT HEMO VALVE WATCHDOG (MISCELLANEOUS) ×2 IMPLANT
PACK CARDIAC CATHETERIZATION (CUSTOM PROCEDURE TRAY) ×2 IMPLANT
STENT SYNERGY DES 3.5X16 (Permanent Stent) ×2 IMPLANT
TRANSDUCER W/STOPCOCK (MISCELLANEOUS) ×2 IMPLANT
TUBING CIL FLEX 10 FLL-RA (TUBING) ×2 IMPLANT
WIRE ASAHI PROWATER 180CM (WIRE) ×2 IMPLANT

## 2018-02-10 NOTE — Care Management Note (Signed)
Case Management Note  Patient Details  Name: Stephen Stone MRN: [id number] Date of Birth: 09/07/1955  Subjective/Objective:  From home , pta indep, he is blind, he is s/p stent intervention, will be on brilinta, he has Medicaid and he states he only pays 1.25 for his medications.  [facility] at [place] has [place] in stock.  Patient states he will need a cab voucher for dc tomorrow,  NCM will let CSW know.  RN will give patient the brilinta 30 day savings coupon.                  Action/Plan: DC home when ready.  Expected Discharge Date:                  Expected Discharge Plan:  Home/Self Care  In-House Referral:     Discharge planning Services  CM Consult  Post Acute Care Choice:    Choice offered to:     DME Arranged:    DME Agency:     HH Arranged:    [place] Agency:     Status of Service:  Completed, signed off  If discussed at [employer] of Stay Meetings, dates discussed:    Additional Comments:  Stephen Mayo, RN 02/10/2018, 1:26 PM

## 2018-02-10 NOTE — ED Notes (Signed)
Cardiology at bedside.

## 2018-02-10 NOTE — ED Notes (Signed)
ED Provider at bedside. 

## 2018-02-10 NOTE — ED Triage Notes (Signed)
Per [facility], pt coming from home with c/o left sided CP radiating to shoulder rated 10/10. Pt took 325 ASA prior to EMS arrival. Pt refused NTG via EMS. Per EMS pt slept on way here. Pt ambulatory on arrival.

## 2018-02-10 NOTE — Progress Notes (Signed)
  Echocardiogram 2D Echocardiogram has been performed.  Stephen Stone 02/10/2018, 12:28 PM

## 2018-02-10 NOTE — Progress Notes (Signed)
ANTICOAGULATION CONSULT NOTE - Follow Up Consult  Pharmacy Consult for heparin Indication: chest pain/ACS  No Known Allergies  Patient Measurements: Height: 6' (182.9 cm) Weight: 225 lb (102.1 kg) IBW/kg (Calculated) : 77.6 Heparin Dosing Weight: 100kg  Vital Signs: Temp: 97.9 F (36.6 C) (07/16 0309) Temp Source: Oral (07/16 0309) BP: 135/90 (07/16 0415) Pulse Rate: 103 (07/16 0415)  Labs: Recent Labs    02/10/18 0320  HGB 13.6  HCT 39.9  PLT 184  CREATININE 0.98    Estimated Creatinine Clearance: 95.4 mL/min (by C-G formula based on SCr of 0.98 mg/dL).   Assessment: 62yo male c/o left-sided CP radiating to shoulder, istat troponin elevated, to begin heparin.  Goal of Therapy:  Heparin level 0.3-0.7 units/ml Monitor platelets by anticoagulation protocol: Yes   Plan:  Will give heparin 4000 units IV bolus x1 followed by gtt at 1400 units/hr and monitor heparin levels and CBC.  Wynona Neat, PharmD, BCPS  02/10/2018,4:52 AM

## 2018-02-10 NOTE — ED Notes (Signed)
NTG marked not given in error. To be given.

## 2018-02-10 NOTE — Progress Notes (Addendum)
Progress Note  Patient Name: Stephen Stone Date of Encounter: 02/10/2018  Primary Cardiologist: [name], MD  Subjective   Still with active chest pain this morning on IV nitro. Rating pain 8/10 at the time of assessment.   Inpatient Medications    Scheduled Meds: . aspirin EC  81 mg Oral Daily  . atorvastatin  80 mg Oral QPM  . famotidine  20 mg Oral Daily  . [START ON 02/11/2018] lisinopril  20 mg Oral Daily  . metoprolol succinate  100 mg Oral Daily  . QUEtiapine  200 mg Oral QHS   Continuous Infusions: . heparin 1,400 Units/hr (02/10/18 0558)   PRN Meds: acetaminophen, nitroGLYCERIN, ondansetron (ZOFRAN) IV   Vital Signs    Vitals:   02/10/18 0615 02/10/18 0630 02/10/18 0700 02/10/18 0730  BP: 126/84 (!) 139/92 126/81 128/85  Pulse: 90 (!) 114 91 97  Resp: 17 18 18 16   Temp:      TempSrc:      SpO2: 100% 100% 98% 99%  Weight:      Height:        Intake/Output Summary (Last 24 hours) at 02/10/2018 4818 Last data filed at 02/10/2018 0303 Gross per 24 hour  Intake 250 ml  Output -  Net 250 ml   Filed Weights   02/10/18 0310  Weight: 225 lb (102.1 kg)    Telemetry    SR - Personally Reviewed  ECG    SR with slight ST elevation in lead III  - Personally Reviewed  Physical Exam   General: Well developed, well nourished, male appearing in no acute distress. Head: Normocephalic, atraumatic.  Neck: Supple without bruits, JVD. Lungs:  Resp regular and unlabored, CTA. Heart: RRR, S1, S2, no S3, S4, or murmur; no rub. Abdomen: Soft, non-tender, non-distended with normoactive bowel sounds. No hepatomegaly. No rebound/guarding. No obvious abdominal masses. Extremities: No clubbing, cyanosis, edema. Distal pedal pulses are 2+ bilaterally. Neuro: Alert and oriented X 3. Moves all extremities spontaneously. Psych: Normal affect.  Labs    Chemistry Recent Labs  Lab 02/04/18 1516 02/10/18 0320  NA 140 137  K 4.1 4.0  CL 111 106  CO2 22 22    GLUCOSE 154* 217*  BUN 12 12  CREATININE 1.04 0.98  CALCIUM 8.9 8.7*  GFRNONAA >60 >60  GFRAA >60 >60  ANIONGAP 7 9     Hematology Recent Labs  Lab 02/04/18 1516 02/10/18 0320  WBC 8.5 9.5  RBC 4.63 4.45  HGB 13.8 13.6  HCT 41.7 39.9  MCV 90.1 89.7  MCH 29.8 30.6  MCHC 33.1 34.1  RDW 13.2 13.1  PLT 202 184    Cardiac Enzymes Recent Labs  Lab 02/04/18 1958  TROPONINI <0.03    Recent Labs  Lab 02/04/18 1529 02/04/18 1932 02/10/18 0418  TROPIPOC 0.01 0.03 1.37*     BNPNo results for input(s): BNP, PROBNP in the last 168 hours.   DDimer No results for input(s): DDIMER in the last 168 hours.    Radiology    Dg Chest 2 View  Result Date: 02/10/2018 CLINICAL DATA:  62 year old male with left-sided chest pain. EXAM: CHEST - 2 VIEW COMPARISON:  Chest CT dated 01/31/2018 FINDINGS: The heart size and mediastinal contours are within normal limits. Both lungs are clear. The visualized skeletal structures are unremarkable. IMPRESSION: No active cardiopulmonary disease. Electronically Signed   By: Anner Crete M.D.   On: 02/10/2018 03:36    Cardiac Studies   N/a   Patient  Profile     62 y.o. male with PMH of CAD with stent (BM vision to dLAD), HTN, HL and prostate CA who presented with 2 episodes of chest pain, found to have elevated troponin.    Assessment & Plan    1. NSTEMI: Trop 1.37 this morning. EKG with slight ST elevation in lead III. Remains on IV heparin, and nitro. Still with ongoing chest pain. Does have hx of stent (BM Vision to the dLAD) back in 2013 while in [place]. Given ongoing chest pain will place next case for cardiac cath.  -- The patient understands that risks included but are not limited to stroke (1 in 1000), death (1 in 9), kidney failure [usually temporary] (1 in 500), bleeding (1 in 200), allergic reaction [possibly serious] (1 in 200).  -- Echo pending  2. HTN: stable with current therapy  3. HL: on statin   4. Prostate CA:  reports having seeds implanted back in 5/19. Had radiation yesterday.    Signed, Reino Bellis, NP  02/10/2018, 8:08 AM  Pager # [phone]    I have examined the patient and reviewed assessment and plan and discussed with patient.  Agree with above as stated, and Dr. Maretta Bees earlier assessment.  Continued pain despite IV NTG.  Plan for urgent cardiac cath.  He is blind and this was communicated to the cath lab staff.  D/w Dr. [place].  Transported to the cath lab in stable condition, but still with active pain.  Critical care time 50 minutes.  Larae Grooms   For questions or updates, please contact [facility] Please consult [email or web address] for contact info under Cardiology/STEMI.

## 2018-02-10 NOTE — Progress Notes (Signed)
CRITICAL VALUE ALERT  Critical value received:  TROPONIN 14.43  Date of notification:  02/10/18  Time of notification:  1696   Critical value read back: : YES   Nurse who received alert:  NERI  A.  MD notified (1st page):  Doreene Adas PA  Time of first page:  1330  MD notified (2nd page):  Time of second page:  Responding MD:  Doreene Adas  Time MD responded:  1330

## 2018-02-10 NOTE — H&P (Signed)
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  Despite his blindness he is very independent, recently moved down from [place] to be closer to his daughter during his prostate cancer therapies.   He has had chest pain episodes for the last 2 weeks.  This is his fourth visit to the emergency room in the last 2 weeks for the same.  They are often exertional and radiate down the left arm.  No nausea or vomiting.  He does have some shortness of breath.  Today he was going to [facility] when he had recurrence of chest pain radiating down the left arm with dyspnea.  Therefore he presented to the emergency room again.  Troponin was elevated at 1.37.  ECG was sinus tachycardia without obvious ischemic changes.  On interview, he continues to have mild to moderate angina.  IV nitroglycerin has been ordered but not yet started.  He is also about to be started on IV heparin.  Otherwise, review of systems negative for bleeding episodes, lower extremity edema.  Prior Cardiac Studies  This includes a nuclear stress test in the fall 2018 which was a low risk study without signs of ischemia.  Echocardiogram was performed last month with normal biventricular function, early diastolic dysfunction.  Past Medical History:  Diagnosis Date  . Anxiety   . Coronary artery disease cardioloigst-  dr Stanford Breed--- previously cardiologist from [facility]   hx anterior wall STEMI,  10-05-2011  cardiac cath w/ PCI and BM stent x1 to distal LAD  . Depression   . 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 History of ST elevation myocardial  infarction (STEMI) 10/05/2011    [place], [place]   acute anterior wall--- s/p  Cardiac cath w/ PCI and stent to distal LAD  . Hyperlipidemia   . 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 Prostate cancer [facility]) urologist-  dr wrenn/  onologist-  dr Tammi Klippel   dx Nov 2017 , Gleason 6, in [place]--  and repeat bx 08-19-2017 ,  Stage T1c, Gleason 3+4, PSA 25.20, vol 47cc-- scheduled for radioactive seed implants 12-25-2017  . S/p bare metal coronary artery stent 10-05-2011   [place], [place]   BM stent x1 to distal LAD    Past Surgical History:  Procedure Laterality Date  . CARDIOVASCULAR STRESS TEST  04-23-2017   dr Stanford Breed   Low Risk Nuclear perfusion study w/ no evidence ischemia/  normal LV function and wall motion,  nuclear stress ef 58%  . CORONARY ANGIOPLASTY WITH STENT PLACEMENT  10-05-2011   [facility] in [place], [place] (records scanned in epic)   in setting anterior STEMI-- PCI and BM (Vision) stent x1 to distal LAD normal LVEF 60% with hypokinesis in the mid portion tf the anterolateral wall (30% mLAD,  30% CFx , 95% dLAD)  . CYSTOSCOPY N/A 08/19/2017   Procedure: CYSTOSCOPY;  Surgeon: [name], MD;  Location: [facility];  Service: Urology;  Laterality: N/A;  . CYSTOSCOPY  12/25/2017   Procedure: CYSTOSCOPY;  Surgeon: [name], MD;  Location: [facility];  Service: Urology;;  no seeds noted in bladder  . PROSTATE BIOPSY N/A 08/19/2017   Procedure: BIOPSY TRANSRECTAL ULTRASONIC PROSTATE (TUBP);  Surgeon: [name], MD;  Location: [facility];  Service: Urology;  Laterality: N/A;  . RADIOACTIVE SEED IMPLANT N/A 12/25/2017   Procedure: RADIOACTIVE SEED IMPLANT/BRACHYTHERAPY IMPLANT;  Surgeon: [name], MD;  Location: [facility];  Service: Urology;  Laterality: N/A;  77 seeds implanted  . SPACE OAR INSTILLATION N/A 12/25/2017   Procedure: SPACE OAR INSTILLATION;   Surgeon: [name], MD;  Location: [facility]  Service: Urology;  Laterality: N/A;  . TRANSTHORACIC ECHOCARDIOGRAM  03-21-2016    Dr Ronal [facility])   mild concentric LVH, ef 57-32%, grade 1 diastolic dysfunction/ mild PR/ trace TR    Family History  Problem Relation Age of Onset  . Cancer Mother        pancreatic  . CAD Father   . Cancer Brother        prostate  . Cancer Brother        prostate   Social History:  reports that he has been smoking cigarettes.  He has a 1.50 pack-year smoking history. He has never used smokeless tobacco. He reports that he has current or past drug history. Drug: Marijuana. He reports that he does not drink alcohol.  Allergies: No Known Allergies  No current facility-administered medications on file prior to encounter.    Current Outpatient Medications on File Prior to Encounter  Medication Sig Dispense Refill  . acetaminophen (TYLENOL) 650 MG CR tablet Take 1,300 mg by mouth every 8 (eight) hours as needed for pain.    Marland Kitchen aspirin EC 81 MG tablet Take 81 mg by mouth daily.    Marland Kitchen atorvastatin (LIPITOR) 80 MG tablet Take 1 tablet (80 mg total) by mouth every evening. 90 tablet 3  . lisinopril (PRINIVIL,ZESTRIL) 20 MG tablet Take 1 tablet (20 mg total) by mouth daily. 90 tablet 3  . metoprolol succinate (TOPROL-XL) 100 MG 24 hr tablet Take 1 tablet (100 mg total) by mouth daily. Take with or immediately following a meal. (Patient taking differently: Take 100 mg by mouth daily. ) 90 tablet 3  . QUEtiapine (SEROQUEL) 200 MG tablet Take 1 tablet (200 mg total) by mouth at bedtime. 90 tablet 3  . ranitidine (ZANTAC) 150 MG tablet Take 1 tablet (150 mg total) by mouth 2 (two) times daily. 60 tablet 0  . aspirin 325 MG EC tablet Take 1 tablet (325 mg total) by mouth daily. (Patient not taking: Reported on 01/31/2018) 90 tablet 30  . traMADol (ULTRAM) 50 MG tablet Take 1 tablet (50 mg total) by mouth every 6 (six) hours as needed. (Patient not  taking: Reported on 01/31/2018) 8 tablet 0    _0 @ _1 @  Results for orders placed or performed during the hospital encounter of 02/10/18 (from the past 48 hour(s))  Basic metabolic panel     Status: Abnormal   Collection Time: 02/10/18  3:20 AM  Result Value Ref Range   Sodium 137 135 - 145 mmol/L   Potassium 4.0 3.5 - 5.1 mmol/L   Chloride 106 98 - 111 mmol/L    Comment: Please note change in reference range.   CO2 22 22 - 32 mmol/L   Glucose, Bld 217 (H) 70 - 99 mg/dL    Comment: Please note change in reference range.   BUN 12 8 - 23 mg/dL    Comment: Please  note change in reference range.   Creatinine, Ser 0.98 0.61 - 1.24 mg/dL   Calcium 8.7 (L) 8.9 - 10.3 mg/dL   GFR calc non Af Amer >60 >60 mL/min   GFR calc Af Amer >60 >60 mL/min    Comment: (NOTE) The eGFR has been calculated using the CKD EPI equation. This calculation has not been validated in all clinical situations. eGFR's persistently <60 mL/min signify possible Chronic Kidney Disease.    Anion gap 9 5 - 15    Comment: Performed at [facility] [address] [place] [address]  CBC     Status: None   Collection Time: 02/10/18  3:20 AM  Result Value Ref Range   WBC 9.5 4.0 - 10.5 K/uL   RBC 4.45 4.22 - 5.81 MIL/uL   Hemoglobin 13.6 13.0 - 17.0 g/dL   HCT 39.9 39.0 - 52.0 %   MCV 89.7 78.0 - 100.0 fL   MCH 30.6 26.0 - 34.0 pg   MCHC 34.1 30.0 - 36.0 g/dL   RDW 13.1 11.5 - 15.5 %   Platelets 184 150 - 400 K/uL    Comment: Performed at [facility], [place] [address], [place], [place] [address]  I-stat troponin, ED     Status: Abnormal   Collection Time: 02/10/18  4:18 AM  Result Value Ref Range   Troponin i, poc 1.37 (HH) 0.00 - 0.08 ng/mL   Comment NOTIFIED PHYSICIAN    Comment 3            Comment: Due to the release kinetics of cTnI, a negative result within the first hours of the onset of symptoms does not rule out myocardial infarction with certainty. If  myocardial infarction is still suspected, repeat the test at appropriate intervals.    Dg Chest 2 View  Result Date: 02/10/2018 CLINICAL DATA:  62 year old male with left-sided chest pain. EXAM: CHEST - 2 VIEW COMPARISON:  Chest CT dated 01/31/2018 FINDINGS: The heart size and mediastinal contours are within normal limits. Both lungs are clear. The visualized skeletal structures are unremarkable. IMPRESSION: No active cardiopulmonary disease. Electronically Signed   By: Anner Crete M.D.   On: 02/10/2018 03:36    ECG/Tele: Sinus tachycardia without obvious ischemic changes  ROS: As above. Otherwise, review of systems is negative unless per above HPI  Vitals:   02/10/18 0303 02/10/18 0309 02/10/18 0310 02/10/18 0415  BP:  (!) 148/107  135/90  Pulse:  (!) 110  (!) 103  Resp:  20  16  Temp:  97.9 F (36.6 C)    TempSrc:  Oral    SpO2: 98% 100%  100%  Weight:   102.1 kg (225 lb)   Height:   6' (1.829 m)    Wt Readings from Last 10 Encounters:  02/10/18 102.1 kg (225 lb)  02/04/18 102.5 kg (226 lb)  01/29/18 102.5 kg (226 lb)  12/25/17 103.2 kg (227 lb 8 oz)  10/22/17 103.1 kg (227 lb 6.4 oz)  09/24/17 102.9 kg (226 lb 12.8 oz)  09/17/17 102.5 kg (226 lb)  08/19/17 102.3 kg (225 lb 9.6 oz)  08/13/17 102.1 kg (225 lb)  06/18/17 107 kg (236 lb)    PE:  General: No acute distress HEENT: Atraumatic, mucous membranes moist. No JVD at 45 degrees. No HJR. Clouded lens. CV: tachy, regular no murmurs, gallops.  Respiratory: Clear, no crackles. Normal work of breathing ABD: Non-distended and non-tender. No palpable organomegaly.  Extremities: 2+ radial pulses  bilaterally. 0 edema. Neuro/Psych: CN grossly intact, alert and oriented  Assessment/Plan History of CAD Recurrent chest pain for 2 weeks, + trop --> NSTEMI  NSTEMI - S/P 325 ASA, IV UF Heparin, IV NTG gtt - TTE ordered - Tele bed - NPO; timing of ischemic evaluation pending clinical course and resolution of angina.   I've alerted oncoming team of residual symptoms and need for early re-assessment in case of needing an early invasive strategy - Continue home metop, atorva  Lolita Cram Means  MD 02/10/2018, 6:12 AM

## 2018-02-10 NOTE — ED Notes (Signed)
Stephen Stone, [place] notified of critical trop

## 2018-02-10 NOTE — Interval H&P Note (Signed)
History and Physical Interval Note:  02/10/2018 8:50 AM  Dayn Curet  has presented today for surgery, with the diagnosis of cp  The various methods of treatment have been discussed with the patient and family. After consideration of risks, benefits and other options for treatment, the patient has consented to  Procedure(s): LEFT HEART CATH AND CORONARY ANGIOGRAPHY (N/A) as a surgical intervention .  The patient's history has been reviewed, patient examined, no change in status, stable for surgery.  I have reviewed the patient's chart and labs.  Questions were answered to the patient's satisfaction.    Cath Lab Visit (complete for each Cath Lab visit)  Clinical Evaluation Leading to the Procedure:   ACS: Yes.    Non-ACS:    Anginal Classification: CCS IV  Anti-ischemic medical therapy: Maximal Therapy (2 or more classes of medications)  Non-Invasive Test Results: No non-invasive testing performed  Prior CABG: No previous CABG       [facility] 02/10/2018 8:50 AM

## 2018-02-10 NOTE — Progress Notes (Signed)
TRBAND REMOVAL  LOCATION:    right radial  DEFLATED PER PROTOCOL:    Yes.    TIME BAND OFF / DRESSING APPLIED:   1400  SITE UPON ARRIVAL:    Level 0  SITE AFTER BAND REMOVAL:    Level 0  CIRCULATION SENSATION AND MOVEMENT:    Within Normal Limits   Yes.    COMMENTS:   Tolerated procedure well

## 2018-02-10 NOTE — ED Provider Notes (Signed)
[facility] EMERGENCY DEPARTMENT Provider Note   CSN: [id number] Arrival date & time: 02/10/18  0258     History   Chief Complaint Chief Complaint  Patient presents with  . Chest Pain    HPI Stephen Stone is a 62 y.o. male.  HPI 65 year old African-American male past medical history significant for anxiety, CAD, depression, glaucoma, legally blind, prostate cancer that presents to the emergency department today for evaluation of chest pain.  Patient reports having chest pain for the past 2 weeks.  States the pain is been intermittent.  Patient has had several ED visits for same with normal work-up.  States that yesterday he was at [facility] when he started developed some left chest pain that radiated to his left arm.  This pain resolved on its own.  Reports that the pain was a 20/10.  States that while he was asleep this evening he awoke from sleep with pain.  This was the same pain.  It was left-sided and radiated to the left arm.  Reports associated diaphoresis and shortness of breath.  Denies any nausea or vomiting.  Patient does report some dyspnea on exertion.  He was given 2 aspirin in route by EMS but refused nitro.  Patient reports history of cardiac catheterization including PCI in 2013.  Patient denies any abdominal pain.  Denies any fevers or chills.  Denies any urinary symptoms or change in bowel habits.  Nothing makes symptoms better or worse.  Patient describes the pain as a pressure.  Reports that his left arm is very heavy feeling.  Pt denies any fever, chill, ha, vision changes, lightheadedness, dizziness, congestion, neck pain,  cough, abd pain, n/v/d, urinary symptoms, change in bowel habits, melena, hematochezia, lower extremity paresthesias.  Past Medical History:  Diagnosis Date  . Anxiety   . Coronary artery disease cardioloigst-  dr Stanford Breed--- previously cardiologist from [facility]   hx anterior wall STEMI,  10-05-2011  cardiac cath w/ PCI and  BM stent x1 to distal LAD  . Depression   . 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 History of ST elevation myocardial infarction (STEMI) 10/05/2011    [place], [place]   acute anterior wall--- s/p  Cardiac cath w/ PCI and stent to distal LAD  . Hyperlipidemia   . 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 Prostate cancer [facility]) urologist-  dr wrenn/  onologist-  dr Tammi Klippel   dx Nov 2017 , Gleason 6, in [place]--  and repeat bx 08-19-2017 ,  Stage T1c, Gleason 3+4, PSA 25.20, vol 47cc-- scheduled for radioactive seed implants 12-25-2017  . S/p bare metal coronary artery stent 10-05-2011   [place], [place]   BM stent x1 to distal LAD    Patient Active Problem List   Diagnosis Date Noted  . Prostate cancer ([place]) 03/19/2017    Past Surgical History:  Procedure Laterality Date  . CARDIOVASCULAR STRESS TEST  04-23-2017   dr Stanford Breed   Low Risk Nuclear perfusion study w/ no evidence ischemia/  normal LV function and wall motion,  nuclear stress ef 58%  . CORONARY ANGIOPLASTY WITH STENT PLACEMENT  10-05-2011   [facility] in [place], [place] (records scanned in epic)   in setting anterior STEMI-- PCI and BM (Vision) stent x1 to distal LAD normal LVEF 60% with hypokinesis in the mid portion tf the anterolateral wall (30% mLAD,  30% CFx ,  95% dLAD)  . CYSTOSCOPY N/A 08/19/2017   Procedure: CYSTOSCOPY;  Surgeon: [name], MD;  Location: [facility];  Service: Urology;  Laterality: N/A;  . CYSTOSCOPY  12/25/2017   Procedure: CYSTOSCOPY;  Surgeon: [name], MD;  Location: [facility];  Service: Urology;;  no seeds noted in bladder  . PROSTATE BIOPSY N/A 08/19/2017   Procedure: BIOPSY TRANSRECTAL ULTRASONIC PROSTATE (TUBP);  Surgeon: [name], MD;  Location: [facility];  Service: Urology;  Laterality: N/A;  . RADIOACTIVE SEED IMPLANT N/A 12/25/2017   Procedure: RADIOACTIVE  SEED IMPLANT/BRACHYTHERAPY IMPLANT;  Surgeon: [name], MD;  Location: [facility];  Service: Urology;  Laterality: N/A;  77 seeds implanted  . SPACE OAR INSTILLATION N/A 12/25/2017   Procedure: SPACE OAR INSTILLATION;  Surgeon: [name], MD;  Location: [facility];  Service: Urology;  Laterality: N/A;  . TRANSTHORACIC ECHOCARDIOGRAM  03-21-2016    Dr Ronal Fear [facility])   mild concentric LVH, ef 71-69%, grade 1 diastolic dysfunction/ mild PR/ trace TR        Home Medications    Prior to Admission medications   Medication Sig Start Date End Date Taking? Authorizing Provider  acetaminophen (TYLENOL) 650 MG CR tablet Take 1,300 mg by mouth every 8 (eight) hours as needed for pain.   Yes [provider]  aspirin EC 81 MG tablet Take 81 mg by mouth daily.   Yes [provider]  atorvastatin (LIPITOR) 80 MG tablet Take 1 tablet (80 mg total) by mouth every evening. 03/13/17  Yes [name], PA-C  lisinopril (PRINIVIL,ZESTRIL) 20 MG tablet Take 1 tablet (20 mg total) by mouth daily. 03/13/17  Yes [name], PA-C  metoprolol succinate (TOPROL-XL) 100 MG 24 hr tablet Take 1 tablet (100 mg total) by mouth daily. Take with or immediately following a meal. Patient taking differently: Take 100 mg by mouth daily.  03/13/17  Yes [name], PA-C  QUEtiapine (SEROQUEL) 200 MG tablet Take 1 tablet (200 mg total) by mouth at bedtime. 03/13/17  Yes [name], PA-C  ranitidine (ZANTAC) 150 MG tablet Take 1 tablet (150 mg total) by mouth 2 (two) times daily. 02/04/18  Yes Deno Etienne, DO  aspirin 325 MG EC tablet Take 1 tablet (325 mg total) by mouth daily. Patient not taking: Reported on 01/31/2018 03/13/17   [name], PA-C  traMADol (ULTRAM) 50 MG tablet Take 1 tablet (50 mg total) by mouth every 6 (six) hours as needed. Patient not taking: Reported on 01/31/2018 12/25/17 12/25/18  [name], MD    Family  History Family History  Problem Relation Age of Onset  . Cancer Mother        pancreatic  . CAD Father   . Cancer Brother        prostate  . Cancer Brother        prostate    Social History Social History   Tobacco Use  . Smoking status: Current Some Day Smoker    Packs/day: 0.25    Years: 6.00    Pack years: 1.50    Types: Cigarettes  . Smokeless tobacco: Never Used  Substance Use Topics  . Alcohol use: No  . Drug use: Yes    Types: Marijuana    Comment: can't remember last time     Allergies   Patient has no known allergies.   Review of Systems Review of Systems  All other systems  reviewed and are negative.    Physical Exam Updated Vital Signs BP 135/90   Pulse (!) 103   Temp 97.9 F (36.6 C) (Oral)   Resp 16   Ht 6' (1.829 m)   Wt 102.1 kg (225 lb)   SpO2 100%   BMI 30.52 kg/m   Physical Exam  Constitutional: He is oriented to person, place, and time. He appears well-developed and well-nourished.  Non-toxic appearance. No distress.  HENT:  Head: Normocephalic and atraumatic.  Nose: Nose normal.  Mouth/Throat: Oropharynx is clear and moist.  Eyes: Conjunctivae are normal. Right eye exhibits no discharge. Left eye exhibits no discharge.  Neck: Normal range of motion. Neck supple. No JVD present. No tracheal deviation present.  Cardiovascular: Normal rate, regular rhythm, normal heart sounds and intact distal pulses. Exam reveals no gallop and no friction rub.  No murmur heard. Pulmonary/Chest: Effort normal and breath sounds normal. No stridor. No respiratory distress. He has no wheezes. He has no rales. He exhibits no tenderness.  No hypoxia or tachypnea.  Abdominal: Soft. Bowel sounds are normal. He exhibits no distension. There is no tenderness. There is no rebound and no guarding.  Musculoskeletal: Normal range of motion.  No lower extremity edema or calf tenderness.  Lymphadenopathy:    He has no cervical adenopathy.  Neurological: He is  alert and oriented to person, place, and time.  Skin: Skin is warm and dry. Capillary refill takes less than 2 seconds. He is not diaphoretic.  Psychiatric: His behavior is normal. Judgment and thought content normal.  Nursing note and vitals reviewed.    ED Treatments / Results  Labs (all labs ordered are listed, but only abnormal results are displayed) Labs Reviewed  BASIC METABOLIC PANEL - Abnormal; Notable for the following components:      Result Value   Glucose, Bld 217 (*)    Calcium 8.7 (*)    All other components within normal limits  I-STAT TROPONIN, ED - Abnormal; Notable for the following components:   Troponin i, poc 1.37 (*)    All other components within normal limits  CBC  HEPARIN LEVEL (UNFRACTIONATED)    EKG EKG Interpretation  Date/Time:  Tuesday February 10 2018 03:08:15 EDT Ventricular Rate:  113 PR Interval:    QRS Duration: 92 QT Interval:  317 QTC Calculation: 435 R Axis:   7 Text Interpretation:  Sinus tachycardia Otherwise normal ECG When compared with ECG of 02/04/2018, HEART RATE has increased Confirmed by Delora Fuel ([id number]) on 02/10/2018 3:14:31 AM   Radiology Dg Chest 2 View  Result Date: 02/10/2018 CLINICAL DATA:  62 year old male with left-sided chest pain. EXAM: CHEST - 2 VIEW COMPARISON:  Chest CT dated 01/31/2018 FINDINGS: The heart size and mediastinal contours are within normal limits. Both lungs are clear. The visualized skeletal structures are unremarkable. IMPRESSION: No active cardiopulmonary disease. Electronically Signed   By: Anner Crete M.D.   On: 02/10/2018 03:36    Procedures .Critical Care Performed by: Doristine Devoid, PA-C Authorized by: Doristine Devoid, PA-C   Critical care provider statement:    Critical care time (minutes):  50   Critical care was necessary to treat or prevent imminent or life-threatening deterioration of the following conditions: NSTEMI with nitro drip and heparin.   Critical care was  time spent personally by me on the following activities:  Development of treatment plan with patient or surrogate, discussions with consultants, discussions with primary provider, evaluation of patient's response to treatment, examination of  patient, obtaining history from patient or surrogate, ordering and performing treatments and interventions, ordering and review of laboratory studies, ordering and review of radiographic studies, pulse oximetry, re-evaluation of patient's condition and review of old charts   (including critical care time)  Medications Ordered in ED Medications  heparin bolus via infusion 4,000 Units (has no administration in time range)  heparin ADULT infusion 100 units/mL (25000 units/284mL sodium chloride 0.45%) (has no administration in time range)  morphine 4 MG/ML injection 4 mg (has no administration in time range)  nitroGLYCERIN 50 mg in dextrose 5 % 250 mL (0.2 mg/mL) infusion (has no administration in time range)     Initial Impression / Assessment and Plan / ED Course  I have reviewed the triage vital signs and the nursing notes.  Pertinent labs & imaging results that were available during my care of the patient were reviewed by me and considered in my medical decision making (see chart for details).     Concern for cardiac etiology of Chest Pain. Cardiology has been consulted and will see patient in the ED for admission. Pt has been re-evaluated prior to consult and VSS, NAD, heart RRR, pain 0/10, lungs CTAB. No acute abnormalities found on EKG. patient's troponin was 1.3.  Patient started on heparin.  I discussed case with Dr. Lamona Curl cardiology.  He is recommended that we start patient on a nitro drip. Pain is currently a 7/10.  Pain is currently a 7/10.  Patient's blood pressure is stable at this time.  Other lab work reassuring.  Chest x-ray unremarkable.  This case was discussed with Dr. Roxanne Mins who has seen the patient and agrees with plan to admit.       Final Clinical Impressions(s) / ED Diagnoses   Final diagnoses:  NSTEMI (non-ST elevated myocardial infarction) [facility])    ED Discharge Orders    None       Aaron Edelman 37/90/24 0973    Delora Fuel, MD 53/29/92 [phone]

## 2018-02-11 ENCOUNTER — Ambulatory Visit: Payer: Medicare Other

## 2018-02-11 ENCOUNTER — Encounter (HOSPITAL_COMMUNITY): Payer: Self-pay | Admitting: Physician Assistant

## 2018-02-11 ENCOUNTER — Telehealth: Payer: Self-pay | Admitting: Cardiology

## 2018-02-11 DIAGNOSIS — E782 Mixed hyperlipidemia: Secondary | ICD-10-CM

## 2018-02-11 DIAGNOSIS — I10 Essential (primary) hypertension: Secondary | ICD-10-CM | POA: Diagnosis present

## 2018-02-11 DIAGNOSIS — E785 Hyperlipidemia, unspecified: Secondary | ICD-10-CM | POA: Diagnosis present

## 2018-02-11 DIAGNOSIS — I25709 Atherosclerosis of coronary artery bypass graft(s), unspecified, with unspecified angina pectoris: Secondary | ICD-10-CM | POA: Diagnosis present

## 2018-02-11 DIAGNOSIS — I2510 Atherosclerotic heart disease of native coronary artery without angina pectoris: Secondary | ICD-10-CM | POA: Diagnosis present

## 2018-02-11 LAB — BASIC METABOLIC PANEL
ANION GAP: 6 (ref 5–15)
BUN: 12 mg/dL (ref 8–23)
CALCIUM: 8.9 mg/dL (ref 8.9–10.3)
CHLORIDE: 110 mmol/L (ref 98–111)
CO2: 24 mmol/L (ref 22–32)
Creatinine, Ser: 0.98 mg/dL (ref 0.61–1.24)
GFR calc non Af Amer: >60 mL/min (ref >60)
Glucose, Bld: 130 mg/dL — ABNORMAL HIGH (ref 70–99)
Potassium: 4 mmol/L (ref 3.5–5.1)
Sodium: 140 mmol/L (ref 135–145)

## 2018-02-11 LAB — CBC
HCT: 38.3 % — ABNORMAL LOW (ref 39.0–52.0)
HEMOGLOBIN: 12.7 g/dL — AB (ref 13.0–17.0)
MCH: 30.3 pg (ref 26.0–34.0)
MCHC: 33.2 g/dL (ref 30.0–36.0)
MCV: 91.4 fL (ref 78.0–100.0)
Platelets: 164 10*3/uL (ref 150–400)
RBC: 4.19 MIL/uL — AB (ref 4.22–5.81)
RDW: 13.5 % (ref 11.5–15.5)
WBC: 9.2 10*3/uL (ref 4.0–10.5)

## 2018-02-11 MED ORDER — NITROGLYCERIN 0.4 MG SL SUBL: 0.4000 mg | SUBLINGUAL_TABLET | SUBLINGUAL | 2 refills | Status: DC | PRN

## 2018-02-11 MED ORDER — TICAGRELOR 90 MG PO TABS: 90.0000 mg | ORAL_TABLET | Freq: Two times a day (BID) | ORAL | 0 refills | Status: DC

## 2018-02-11 MED ORDER — TICAGRELOR 90 MG PO TABS: 90.0000 mg | ORAL_TABLET | Freq: Two times a day (BID) | ORAL | 3 refills | Status: DC

## 2018-02-11 NOTE — Telephone Encounter (Signed)
New message   TCM appt with A. Duke - PA on 7/25

## 2018-02-11 NOTE — Progress Notes (Signed)
CARDIAC REHAB PHASE I   PRE:  Rate/Rhythm: 71 SR  BP:  Supine:   Sitting: 124/52  Standing:    SaO2:   MODE:  Ambulation: 1000 ft   POST:  Rate/Rhythm: 81 SR  BP:  Supine:   Sitting: 125/46  Standing:    SaO2:  0950-1050 Assisted X 1 to ambulate and pt used his cane. He tolerated ambulation well without c/o of cp of SOB. VS stable. Pt back to side of bed after walk with call light in reach. Completed MI and stent education with pt.We discussed MI, risk factors, modifications, exercise guidelines, proper use of sl NTG, calling 911,heart healthy diet and Outpt. CRP. He states that he quit smoking 45 days ago and that it has gone well. He quit cold [place]. I did provide smoking cessation and quit smart class schedule for him in case he needs it. I encouraged him to continue with his smoking cessation and congratulated him. I will send referral to Outpt. CRP in [place]. Rodney Langton RN 02/11/2018 10:46 AM

## 2018-02-11 NOTE — Progress Notes (Addendum)
Progress Note  Patient Name: Stephen Stone Date of Encounter: 02/11/2018  Primary Cardiologist: [name], MD   Subjective   Pt denies chest pain. Rested overnight, no complaints. Ready for discharge.  Inpatient Medications    Scheduled Meds: . aspirin  81 mg Oral Daily  . atorvastatin  80 mg Oral QPM  . enoxaparin (LOVENOX) injection  40 mg Subcutaneous Q24H  . famotidine  20 mg Oral Daily  . lisinopril  20 mg Oral Daily  . metoprolol succinate  100 mg Oral Daily  . QUEtiapine  200 mg Oral QHS  . sodium chloride flush  3 mL Intravenous Q12H  . ticagrelor  90 mg Oral BID   Continuous Infusions: . sodium chloride     PRN Meds: sodium chloride, acetaminophen, nitroGLYCERIN, ondansetron (ZOFRAN) IV, sodium chloride flush   Vital Signs    Vitals:   02/10/18 1938 02/10/18 2000 02/11/18 0056 02/11/18 0639  BP: (!) 124/52   (!) 106/48  Pulse: 80   77  Resp: (!) 22 (!) 23  20  Temp: 98.7 F (37.1 C)   98 F (36.7 C)  TempSrc: Oral   Oral  SpO2: 97%   97%  Weight:   227 lb 8.2 oz (103.2 kg)   Height:        Intake/Output Summary (Last 24 hours) at 02/11/2018 0720 Last data filed at 02/10/2018 2000 Gross per 24 hour  Intake 1508.69 ml  Output 150 ml  Net 1358.69 ml   Filed Weights   02/10/18 0310 02/11/18 0056  Weight: 225 lb (102.1 kg) 227 lb 8.2 oz (103.2 kg)    Telemetry    sinus - Personally Reviewed  ECG    Sinus, poor R wave progression - Personally Reviewed  Physical Exam   GEN: No acute distress.   Neck: No JVD Cardiac: RRR, no murmurs, rubs, or gallops.  Respiratory: Clear to auscultation bilaterally. GI: Soft, nontender, non-distended. Right radial access site C/D/I MS: No edema; No deformity. Neuro:  Nonfocal  Psych: Normal affect   Labs    Chemistry Recent Labs  Lab 02/04/18 1516 02/10/18 0320 02/11/18 0234  NA 140 137 140  K 4.1 4.0 4.0  CL 111 106 110  CO2 22 22 24   GLUCOSE 154* 217* 130*  BUN 12 12 12   CREATININE  1.04 0.98 0.98  CALCIUM 8.9 8.7* 8.9  GFRNONAA >60 >60 >60  GFRAA >60 >60 >60  ANIONGAP 7 9 6      Hematology Recent Labs  Lab 02/04/18 1516 02/10/18 0320 02/11/18 0234  WBC 8.5 9.5 9.2  RBC 4.63 4.45 4.19*  HGB 13.8 13.6 12.7*  HCT 41.7 39.9 38.3*  MCV 90.1 89.7 91.4  MCH 29.8 30.6 30.3  MCHC 33.1 34.1 33.2  RDW 13.2 13.1 13.5  PLT 202 184 164    Cardiac Enzymes Recent Labs  Lab 02/04/18 1958 02/10/18 0738 02/10/18 1224 02/10/18 1828  TROPONINI <0.03 1.19* 14.43* 8.24*    Recent Labs  Lab 02/04/18 1529 02/04/18 1932 02/10/18 0418  TROPIPOC 0.01 0.03 1.37*     BNPNo results for input(s): BNP, PROBNP in the last 168 hours.   DDimer No results for input(s): DDIMER in the last 168 hours.   Radiology    Dg Chest 2 View  Result Date: 02/10/2018 CLINICAL DATA:  62 year old male with left-sided chest pain. EXAM: CHEST - 2 VIEW COMPARISON:  Chest CT dated 01/31/2018 FINDINGS: The heart size and mediastinal contours are within normal limits. Both lungs are clear.  The visualized skeletal structures are unremarkable. IMPRESSION: No active cardiopulmonary disease. Electronically Signed   By: Anner Crete M.D.   On: 02/10/2018 03:36    Cardiac Studies   Left heart cath 02/10/18:  Prox LAD lesion is 30% stenosed.  Ost 1st Mrg lesion is 40% stenosed.  Prox RCA to Mid RCA lesion is 99% stenosed.  Post intervention, there is a 0% residual stenosis.  A drug-eluting stent was successfully placed using a STENT SYNERGY DES 3.5X16.  LV end diastolic pressure is normal.   1. Single vessel obstructive CAD.     - prior stent in the mid LAD with 30% in stent disease    - 99% mid RCA 2. Normal LVEDP 3. Successful PCI of the mid RCA with DES x 1.   Recommend uninterrupted dual antiplatelet therapy with Aspirin 81mg  daily and Ticagrelor 90mg  twice daily for a minimum of 12 months (ACS - Class I recommendation). Anticipate DC in am. Will assess LV function by Echo.     Echo 02/10/18: Study Conclusions - Left ventricle: The cavity size was normal. Wall thickness was   increased in a pattern of moderate LVH. Systolic function was   vigorous. The estimated ejection fraction was in the range of 65%   to 70%. Wall motion was normal; there were no regional wall   motion abnormalities. Doppler parameters are consistent with   abnormal left ventricular relaxation (grade 1 diastolic   dysfunction). - Aortic root: The aortic root was mildly dilated. - Mitral valve: Calcified annulus.  Impressions: - Vigorous LV systolic function; moderate LVH; mild diastolic   dysfunction; mildly dilated aortic root.  Patient Profile     62 y.o. male with PMH of CAD with stent (BM vision to dLAD), HTN, HL and prostate CA who presented with 2 episodes of chest pain, found to have elevated troponin. Left heart cath yesterday with 99% stenosis in the mid RCA treated with DES.  Assessment & Plan    1. NSTEMI, s/p BMS to distal LAD (2013) - troponin 1.19 --> 14.43 --> 8.24 - left heart cath yesterday with 99% stenosis in mid RCA treated with DES - pt did well overnight, no further chest pain - DAPT with ASA and brilinta   2. HTN - lisinopril, toprol - pressures are well-controlled - no medication changes   3. HLD - 09/17/2017: Cholesterol, Total 127; HDL 31; LDL Calculated 65; Triglycerides 155 - continue statin     For questions or updates, please contact [facility] Please consult [email or web address] for contact info under Cardiology/STEMI.      Signed, Tami Lin Duke, PA  02/11/2018, 7:20 AM    I have examined the patient and reviewed assessment and plan and discussed with patient.  Agree with above as stated.  Status post RCA stent.  I stressed the importance of dual antiplatelet therapy.  Right wrist is stable without hematoma.  He will take it easy for a few days.  Continue aggressive secondary prevention including lipid-lowering therapy and blood  pressure control.  He has stopped smoking already.  Okay for discharge today.  Cardiac rehab would be beneficial for him as well.  Larae Grooms

## 2018-02-11 NOTE — Telephone Encounter (Signed)
Currently admitted.

## 2018-02-11 NOTE — Discharge Summary (Addendum)
Discharge Summary    Patient ID: Stephen Stone,  MRN: [id number], DOB/AGE: 64/28/1957 64 y.o.  Admit date: 02/10/2018 Discharge date: 02/11/2018  Primary Care Provider: [name] Primary Cardiologist: [name], MD  Discharge Diagnoses    Principal Problem:   NSTEMI (non-ST elevated myocardial infarction) [facility]) Active Problems:   Prostate cancer [facility])   Hyperlipidemia   Essential hypertension   Coronary artery disease   Allergies No Known Allergies  Diagnostic Studies/Procedures    Left heart cath 02/10/18:  Prox LAD lesion is 30% stenosed.  Ost 1st Mrg lesion is 40% stenosed.  Prox RCA to Mid RCA lesion is 99% stenosed.  Post intervention, there is a 0% residual stenosis.  A drug-eluting stent was successfully placed using a STENT SYNERGY DES 3.5X16.  LV end diastolic pressure is normal.   1. Single vessel obstructive CAD.     - prior stent in the mid LAD with 30% in stent disease    - 99% mid RCA 2. Normal LVEDP 3. Successful PCI of the mid RCA with DES x 1.   Recommend uninterrupted dual antiplatelet therapy with Aspirin 81mg  daily and Ticagrelor 90mg  twice daily for a minimum of 12 months (ACS - Class I recommendation). Anticipate DC in am. Will assess LV function by Echo.    Echo 02/10/18: Study Conclusions - Left ventricle: The cavity size was normal. Wall thickness was   increased in a pattern of moderate LVH. Systolic function was   vigorous. The estimated ejection fraction was in the range of 65%   to 70%. Wall motion was normal; there were no regional wall   motion abnormalities. Doppler parameters are consistent with   abnormal left ventricular relaxation (grade 1 diastolic   dysfunction). - Aortic root: The aortic root was mildly dilated. - Mitral valve: Calcified annulus.  Impressions: - Vigorous LV systolic function; moderate LVH; mild diastolic   dysfunction; mildly dilated aortic root.   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  Despite his blindness he is very independent, recently moved down from [place] to be closer to his daughter during his prostate cancer therapies.   He has had chest pain episodes for the last 2 weeks.  This is his fourth visit to the emergency room in the last 2 weeks for the same.  They are often exertional and radiate down the left arm.  No nausea or vomiting.  He does have some shortness of breath.  On 02/10/18, he was going to [facility] when he had recurrence of chest pain radiating down the left arm with dyspnea.  Therefore he presented to the emergency room again.  Troponin was elevated at 1.37.  ECG was sinus tachycardia without obvious ischemic changes.  On interview, he continued to have mild to moderate angina.  IV nitroglycerin was ordered but not yet started.  He is also about to be started on IV heparin.  Otherwise, review of systems negative for bleeding episodes, lower extremity edema.   Prior Cardiac Studies This includes a nuclear stress test in the fall 2018 which was a low risk study without signs of ischemia.  Echocardiogram was performed last month with normal biventricular function, early diastolic dysfunction.  Hospital Course  Consultants: none  NSTEMI The patient continued to have active chest pain and was therefore taken to the cath lab. Left heart cath showed 99% stenosis in the mid RCA, treated with DES. He tolerated the procedure well and recovered overnight. Right radial is C/D/I. He denies further chest pain. He will discharge on DAPT for 12 months with ASA and brilinta.   HTN Pressures have been well-controlled on lisinopril and toprol. No medication  changes.  HLD 09/17/2017: Cholesterol, Total 127; HDL 31; LDL Calculated 65; Triglycerides 155 Continue lipitor 80 mg.   _____________  Discharge Vitals Blood pressure 112/74, pulse 80, temperature 98.2 F (36.8 C), resp. rate 15, height 6' (1.829 m), weight 227 lb 8.2 oz (103.2 kg), SpO2 99 %.  Filed Weights   02/10/18 0310 02/11/18 0056  Weight: 225 lb (102.1 kg) 227 lb 8.2 oz (103.2 kg)    Labs & Radiologic Studies    CBC Recent Labs    02/10/18 0320 02/11/18 0234  WBC 9.5 9.2  HGB 13.6 12.7*  HCT 39.9 38.3*  MCV 89.7 91.4  PLT 184 182   Basic Metabolic Panel Recent Labs    02/10/18 0320 02/11/18 0234  NA 137 140  K 4.0 4.0  CL 106 110  CO2 22 24  GLUCOSE 217* 130*  BUN 12 12  CREATININE 0.98 0.98  CALCIUM 8.7* 8.9   Liver Function Tests No results for input(s): AST, ALT, ALKPHOS, BILITOT, PROT, ALBUMIN in the last 72 hours. No results for input(s): LIPASE, AMYLASE in the last 72 hours. Cardiac Enzymes Recent Labs    02/10/18 0738 02/10/18 1224 02/10/18 1828  TROPONINI 1.19* 14.43* 8.24*   BNP Invalid input(s): POCBNP D-Dimer No results for input(s): DDIMER in the last 72 hours. Hemoglobin A1C No results for input(s): HGBA1C in the last 72 hours. Fasting Lipid Panel No results for input(s): CHOL, HDL, LDLCALC, TRIG, CHOLHDL, LDLDIRECT in the last 72 hours. Thyroid Function Tests No results for input(s): TSH, T4TOTAL, T3FREE, THYROIDAB in the last 72 hours.  Invalid input(s): FREET3 _____________  Dg Chest 2 View  Result Date: 02/10/2018 CLINICAL DATA:  62 year old male with left-sided chest pain. EXAM: CHEST - 2 VIEW COMPARISON:  Chest CT dated 01/31/2018 FINDINGS: The heart size and mediastinal contours are within normal limits. Both lungs are clear. The visualized skeletal structures are unremarkable. IMPRESSION: No active cardiopulmonary disease. Electronically Signed   By: Anner Crete M.D.   On: 02/10/2018 03:36   Dg Chest 2  View  Result Date: 01/29/2018 CLINICAL DATA:  Chest pain tonight. History of heart stent and prostate seeds. Radiation therapy to begin on Monday. EXAM: CHEST - 2 VIEW COMPARISON:  11/21/2017 FINDINGS: The heart size and mediastinal contours are within normal limits. Both lungs are clear. The visualized skeletal structures are unremarkable. IMPRESSION: No active cardiopulmonary disease. Electronically Signed   By: Lucienne Capers M.D.   On: 01/29/2018 00:51   Ct Angio Chest Pe W/cm &/or Wo Cm  Result Date: 01/31/2018 CLINICAL DATA:  Chest pain EXAM: CT ANGIOGRAPHY CHEST WITH CONTRAST TECHNIQUE: Multidetector CT imaging of the chest was performed using the standard protocol during bolus administration of intravenous contrast. Multiplanar CT image reconstructions and MIPs were obtained to evaluate the vascular anatomy. CONTRAST:  132mL ISOVUE-370 IOPAMIDOL (ISOVUE-370) INJECTION 76% COMPARISON:  January 29, 2018 FINDINGS: Cardiovascular: There is no demonstrable pulmonary embolus. There is no thoracic aortic aneurysm. No dissection evident. It should be noted that the contrast bolus in the aorta is less than optimal  for potential dissection assessment. Visualized great vessels appear unremarkable. Note that the left common and right innominate arteries arise as a common trunk, an anatomic variant. There is no pericardial effusion or pericardial thickening. There are foci of coronary artery calcification evident. Mediastinum/Nodes: Visualized thyroid appears normal. There are subcentimeter mediastinal lymph nodes. There is no adenopathy by size criteria. There are occasional foci lymph node calcification consistent with prior granulomatous disease. No esophageal lesions are evident. Lungs/Pleura: There is no frank edema or consolidation. There is mild mosaic attenuation in the lungs suggesting atelectasis with probable small airways obstructive disease. No appreciable pleural effusion or pleural thickening evident.  Upper Abdomen: There is a small calcified granuloma in the spleen. Visualized upper abdominal structures otherwise appear unremarkable. Musculoskeletal: There are no blastic or lytic bone lesions. No chest wall lesions are evident. Review of the MIP images confirms the above findings. IMPRESSION: 1. No demonstrable pulmonary embolus. No thoracic aortic aneurysm or dissection. Note that the contrast bolus in the aorta is less than optimal for assessment for potential dissection. Foci of coronary artery calcification are noted. 2. No frank edema or consolidation. Areas of mosaic attenuation likely represent atelectasis and a degree of small airways obstructive disease. 3.  No thoracic adenopathy. 4.  Evidence of prior granulomatous disease. Electronically Signed   By: Lowella Grip III M.D.   On: 01/31/2018 14:29   Disposition   Pt is being discharged home today in good condition.  Follow-up Plans & Appointments    Follow-up Information    Ledora Bottcher, [place] Follow up on 02/19/2018.   Specialties:  Physician Assistant, Cardiology, Radiology Why:  9:30 am TCM for hospital follow up Contact information: [address] [place] [address] [phone]          Discharge Instructions    AMB Referral to Cardiac Rehabilitation - Phase II   Complete by:  As directed    Diagnosis:   NSTEMI Coronary Stents     Amb Referral to Cardiac Rehabilitation   Complete by:  As directed    Diagnosis:   Coronary Stents NSTEMI     Diet - low sodium heart healthy   Complete by:  As directed    Discharge instructions   Complete by:  As directed    No driving for 1 week. No lifting over 5 lbs for 1 week. No sexual activity for 1 week.  Keep procedure site clean & dry. If you notice increased pain, swelling, bleeding or pus, call/return!  You may shower, but no soaking baths/hot tubs/pools for 1 week.   Increase activity slowly   Complete by:  As directed       Discharge  Medications   Allergies as of 02/11/2018   No Known Allergies     Medication List    TAKE these medications   acetaminophen 650 MG CR tablet Commonly known as:  TYLENOL Take 1,300 mg by mouth every 8 (eight) hours as needed for pain.   aspirin EC 81 MG tablet Take 81 mg by mouth daily. What changed:  Another medication with the same name was removed. Continue taking this medication, and follow the directions you see here.   atorvastatin 80 MG tablet Commonly known as:  LIPITOR Take 1 tablet (80 mg total) by mouth every evening.   lisinopril 20 MG tablet Commonly known as:  PRINIVIL,ZESTRIL Take 1 tablet (20 mg total) by mouth daily.   metoprolol succinate 100 MG 24 hr tablet Commonly known as:  TOPROL-XL Take 1 tablet (100 mg total) by mouth daily. Take with or immediately following a meal. What changed:  additional instructions   nitroGLYCERIN 0.4 MG SL tablet Commonly known as:  NITROSTAT Place 1 tablet (0.4 mg total) under the tongue every 5 (five) minutes x 3 doses as needed for chest pain.   QUEtiapine 200 MG tablet Commonly known as:  SEROQUEL Take 1 tablet (200 mg total) by mouth at bedtime.   ranitidine 150 MG tablet Commonly known as:  ZANTAC Take 1 tablet (150 mg total) by mouth 2 (two) times daily.   ticagrelor 90 MG Tabs tablet Commonly known as:  BRILINTA Take 1 tablet (90 mg total) by mouth 2 (two) times daily.   traMADol 50 MG tablet Commonly known as:  ULTRAM Take 1 tablet (50 mg total) by mouth every 6 (six) hours as needed.        Acute coronary syndrome (MI, NSTEMI, STEMI, etc) this admission?: Yes.     AHA/ACC Clinical Performance & Quality Measures: 1. Aspirin prescribed? - Yes 2. ADP Receptor Inhibitor (Plavix/Clopidogrel, Brilinta/Ticagrelor or Effient/Prasugrel) prescribed (includes medically managed patients)? - Yes 3. Beta Blocker prescribed? - Yes 4. High Intensity Statin (Lipitor 40-80mg  or Crestor 20-40mg ) prescribed? - Yes 5. EF  assessed during THIS hospitalization? - Yes 6. For EF <40%, was ACEI/ARB prescribed? - Not Applicable (EF >/= 63%) 7. For EF <40%, Aldosterone Antagonist (Spironolactone or Eplerenone) prescribed? - Not Applicable (EF >/= 81%) 8. Cardiac Rehab Phase II ordered (Included Medically managed Patients)? - Yes     Outstanding Labs/Studies   none  Duration of Discharge Encounter   Greater than 30 minutes including physician time.  Signed, Tami Lin Duke, PA 02/11/2018, 10:58 AM   I have examined the patient and reviewed assessment and plan and discussed with patient.  Agree with above as stated.  Status post RCA stent.  I stressed the importance of dual antiplatelet therapy.  Right wrist is stable without hematoma.  He will take it easy for a few days.  Continue aggressive secondary prevention including lipid-lowering therapy and blood pressure control.  He has stopped smoking already.  Okay for discharge today.  Cardiac rehab would be beneficial for him as well.  Larae Grooms

## 2018-02-12 ENCOUNTER — Telehealth: Payer: Self-pay | Admitting: Radiation Oncology

## 2018-02-12 ENCOUNTER — Ambulatory Visit: Payer: Medicare Other

## 2018-02-12 NOTE — Telephone Encounter (Signed)
Phoned patient's home to inquire if he plans to present for treatment today. No answer. Left telephone message with my contact number. Informed therapist on L1 that he was discharged home yesterday in good condition.

## 2018-02-12 NOTE — Telephone Encounter (Addendum)
Patient contacted regarding discharge from [facility] on 02/12/18.  Patient understands to follow up with provider A. Duke PA on 02/19/18 at 0930 at [facility]. Patient understands discharge instructions? YES Patient understands medications and regiment? YES Patient understands to bring all medications to this visit? YES  Patient expressed frustration with cancer center contacting him about his radiation and also having been to the hospital multiple times for chest pain before figuring out what was wrong, but is very appreciative of the care he received and feels he received top notch service from [facility]

## 2018-02-13 ENCOUNTER — Telehealth (HOSPITAL_COMMUNITY): Payer: Self-pay

## 2018-02-13 ENCOUNTER — Ambulatory Visit: Payer: Medicare Other

## 2018-02-13 ENCOUNTER — Telehealth: Payer: Self-pay | Admitting: Radiation Oncology

## 2018-02-13 NOTE — Telephone Encounter (Signed)
Patient has not presented again today for radiation treatment. Phoned patient's home again to inquire about status. No answer. Left a detailed message with my contact information requesting a return call.

## 2018-02-13 NOTE — Telephone Encounter (Signed)
Pt insurance is active and benefits verified through Medicare. Co-pay $0.00, DED $0.00/$0.00 met, out of pocket $0.00/$0.00 met, co-insurance 0%. No pre-authorization. Passport, 02/13/18 @ 9:06am, REF#[id number] ° °2ndary insurance is active and benefits verified through Medicaid. Co-pay $0.00, DED $0.00/$0.00 met, out of pocket $0.00/$0.00 met, co-insurance 0%. No pre-authorization. Passport, 02/13/18 @ 9:16am, REF#[id number] ° °Will contact patient to see if he is interested in the Cardiac Rehab Program. If interested, patient will need to complete follow up appt. Once completed, patient will be contacted for scheduling upon review by the RN Navigator. °

## 2018-02-16 ENCOUNTER — Ambulatory Visit: Payer: Medicare Other

## 2018-02-17 ENCOUNTER — Ambulatory Visit: Payer: Medicare Other

## 2018-02-17 ENCOUNTER — Telehealth: Payer: Self-pay | Admitting: Urology

## 2018-02-17 ENCOUNTER — Telehealth: Payer: Self-pay | Admitting: Radiation Oncology

## 2018-02-17 NOTE — Telephone Encounter (Signed)
Received voicemail message from patient's daughter, Stephen Stone, yesterday at 1624 requesting a return call reference her father. Phoned her back. She reports her father doesn't wish to continue radiation therapy at this time. She reports her father feels like the radiation staff weren't concerned about him as a person since they called to inquire when he planned to present for radiation after he was discharged from the hospital following a heart attack. Listened as Stephen Stone verbalized her father's concerns. Attempted to assure her that her father's health is our upmost priority. Apologized for any actions on our behalf that would have made him feel less than important. Explained that one of our providers may reach out to her to discuss how he could be effected by stopping radiation. She expressed understanding and welcomed their call. She verbalized appreciation for the call. I expressed appreciation for taking time to speak with me.

## 2018-02-17 NOTE — Telephone Encounter (Signed)
I spoke with the patient's daughter, Stephen Stone, to again reassure her that we are indeed concerned with her father's overall health and apologized for any confusion last week regarding calls from the therapy staff to reschedule missed treatments.  I assured her that they too have his best interests in mind.  We discussed the importance of continuing prostate IMRT, reassured no conflict with recent heart cath and stent placement, and discussed the potential consequences of stopping treatment at this point.  I reiterated that stopping treatments at this time would have significant impact on the overall success of treatment and she stated her understanding.  She advised that she would have further conversation with her father regarding this and would reach back out to [place] to let us know his wishes.  At this time, she thinks he is so overwhelmed that he just wants to take a "break" from treatment all together.  I confirmed that we will respect his wishes either way but are hopeful that he will complete treatments as planned to have the best chance of curing his prostate cancer possible.  She was appreciative of the call and again, confirmed that she will call back regarding his decision to proceed with treatment or not.   Nicholos Johns, PA-C

## 2018-02-18 ENCOUNTER — Ambulatory Visit: Payer: Medicare Other

## 2018-02-18 NOTE — Progress Notes (Signed)
Cardiology Office Note:    Date:  02/19/2018   ID:  Stephen Stone, DOB 04/04/1956, MRN [id number]  PCP:  [name], PA-C  Cardiologist:  [name], MD   Referring MD: [name], PA-C   Chief Complaint  Patient presents with  . Hospitalization Follow-up    stent placed, denies chest pains, check to make sure arm is healed up and clearance to start lifting weights    History of Present Illness:    Stephen Stone is a 62 y.o. male with a hx of CAD status post bare-metal stenting in 2013-fill to the distal LAD in the setting of a anterior STEMI; NSTEMI s/p DES to RCA 02/10/18, essential hypertension, and hyperlipidemia.  He recently underwent left heart catheterization with DES placed to his proximal RCA.  He has residual disease in the mid LAD, 30% in-stent disease.  He is blind but very independent.  He recently moved here from [place] to be closer to his daughter during his prostate cancer therapy.  He tolerated the heart catheterization well without complications.  He presents today for hospital follow-up.  He has done quite well since his heart catheterization.  Right radial site is clean dry and intact.  He is having some shortness of breath on Brilinta, that is resolved with small doses of caffeine.  Blood pressure is well controlled.  He denies chest pain, shortness of breath, palpitations, lower extremity swelling, and near syncope.  No bleeding problems on aspirin and Brilinta.  He will not return for his 5 weeks of radiation to his prostate.   Past Medical History:  Diagnosis Date  . Anxiety   . Coronary artery disease cardioloigst-  dr Stanford Breed--- previously cardiologist from [facility]   hx anterior wall STEMI,  10-05-2011  cardiac cath w/ PCI and BM stent x1 to distal LAD  . Depression   . Essential hypertension   . 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 Hyperlipidemia   . 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 NSTEMI (non-ST elevated myocardial infarction) ([place]) 02/09/2018  . Prostate cancer [facility]) urologist-  dr wrenn/  onologist-  dr Tammi Klippel   dx Nov 2017 , Gleason 6, in [place]--  and repeat bx 08-19-2017 ,  Stage T1c, Gleason 3+4, PSA 25.20, vol 47cc-- scheduled for radioactive seed implants 12-25-2017  . STEMI (ST elevation myocardial infarction) ([place]) 10/05/2011    [place], [place]   acute anterior wall--- s/p  Cardiac cath w/ PCI and stent to distal LAD    Past Surgical History:  Procedure Laterality Date  . CARDIOVASCULAR STRESS TEST  04-23-2017   dr Stanford Breed   Low Risk Nuclear perfusion study w/ no evidence ischemia/  normal LV function and wall motion,  nuclear stress ef 58%  . CORONARY ANGIOPLASTY WITH STENT PLACEMENT  10-05-2011   [facility] in [place], [place] (records scanned in epic)   in setting anterior STEMI-- PCI and BM (Vision) stent x1 to distal LAD normal LVEF 60% with hypokinesis in the mid portion tf the anterolateral wall (30% mLAD,  30% CFx , 95% dLAD)  . CORONARY STENT INTERVENTION N/A 02/10/2018   Procedure: CORONARY STENT INTERVENTION;  Surgeon: Martinique, Peter M, MD;  Location: [facility];  Service: Cardiovascular;  Laterality: N/A;  . CYSTOSCOPY N/A 08/19/2017   Procedure: CYSTOSCOPY;  Surgeon: [name], MD;  Location: [facility];  Service: Urology;  Laterality: N/A;  . CYSTOSCOPY  12/25/2017   Procedure: CYSTOSCOPY;  Surgeon: [name], MD;  Location: [facility];  Service: Urology;;  no seeds noted in bladder  . EYE SURGERY Bilateral    "for the glaucoma; still went blind after 2-3 operations on each eye" (02/10/2018)  . LEFT HEART CATH AND CORONARY ANGIOGRAPHY N/A 02/10/2018   Procedure: LEFT HEART CATH AND CORONARY ANGIOGRAPHY;  Surgeon: Martinique, Peter M, MD;  Location: [facility] [facility];  Service: Cardiovascular;  Laterality: N/A;  . PROSTATE BIOPSY N/A 08/19/2017   Procedure:  BIOPSY TRANSRECTAL ULTRASONIC PROSTATE (TUBP);  Surgeon: [name], MD;  Location: [facility];  Service: Urology;  Laterality: N/A;  . RADIOACTIVE SEED IMPLANT N/A 12/25/2017   Procedure: RADIOACTIVE SEED IMPLANT/BRACHYTHERAPY IMPLANT;  Surgeon: [name], MD;  Location: [facility];  Service: Urology;  Laterality: N/A;  77 seeds implanted  . SPACE OAR INSTILLATION N/A 12/25/2017   Procedure: SPACE OAR INSTILLATION;  Surgeon: [name], MD;  Location: [facility];  Service: Urology;  Laterality: N/A;  . TRANSTHORACIC ECHOCARDIOGRAM  03-21-2016    Dr Ronal [facility])   mild concentric LVH, ef 52-77%, grade 1 diastolic dysfunction/ mild PR/ trace TR    Current Medications: Current Meds  Medication Sig  . acetaminophen (TYLENOL) 650 MG CR tablet Take 1,300 mg by mouth every 8 (eight) hours as needed for pain.  Marland Kitchen aspirin EC 81 MG tablet Take 81 mg by mouth daily.  Marland Kitchen atorvastatin (LIPITOR) 80 MG tablet Take 1 tablet (80 mg total) by mouth every evening.  Marland Kitchen lisinopril (PRINIVIL,ZESTRIL) 20 MG tablet Take 1 tablet (20 mg total) by mouth daily.  . metoprolol succinate (TOPROL-XL) 100 MG 24 hr tablet Take 1 tablet (100 mg total) by mouth daily. Take with or immediately following a meal. (Patient taking differently: Take 100 mg by mouth daily. )  . nitroGLYCERIN (NITROSTAT) 0.4 MG SL tablet Place 1 tablet (0.4 mg total) under the tongue every 5 (five) minutes x 3 doses as needed for chest pain.  Marland Kitchen QUEtiapine (SEROQUEL) 200 MG tablet Take 1 tablet (200 mg total) by mouth at bedtime.  . ranitidine (ZANTAC) 150 MG tablet Take 1 tablet (150 mg total) by mouth 2 (two) times daily. (Patient taking differently: Take 150 mg by mouth as needed. )  . ticagrelor (BRILINTA) 90 MG TABS tablet Take 1 tablet (90 mg total) by mouth 2 (two) times daily.     Allergies:   Patient has no known allergies.   Social History   Socioeconomic History  . Marital status:  Divorced    Spouse name: Not on file  . Number of children: 7  . Years of education: Not on file  . Highest education level: Not on file  Occupational History  . Not on file  Social Needs  . Financial resource strain: Not on file  . Food insecurity:    Worry: Not on file    Inability: Not on file  . Transportation needs:    Medical: Not on file    Non-medical: Not on file  Tobacco Use  . Smoking status: Former Smoker    Packs/day: 0.25    Years: 6.00    Pack years: 1.50    Types: Cigarettes    Last attempt to quit: 12/27/2017    Years since quitting: 0.1  . Smokeless tobacco: Never Used  Substance and Sexual Activity  . Alcohol use: Not Currently  .  Drug use: Not Currently    Types: Marijuana    Comment: 02/10/2018 "in my 20's"  . Sexual activity: Yes  Lifestyle  . Physical activity:    Days per week: Not on file    Minutes per session: Not on file  . Stress: Not on file  Relationships  . Social connections:    Talks on phone: Not on file    Gets together: Not on file    Attends religious service: Not on file    Active member of club or organization: Not on file    Attends meetings of clubs or organizations: Not on file    Relationship status: Not on file  Other Topics Concern  . Not on file  Social History Narrative  . Not on file     Family History: The patient's family history includes CAD in his father; Cancer in his brother, brother, and mother.  ROS:   Please see the history of present illness.    All other systems reviewed and are negative.  EKGs/Labs/Other Studies Reviewed:    The following studies were reviewed today:  Left heart cath 02/10/18:  Prox LAD lesion is 30% stenosed.  Ost 1st Mrg lesion is 40% stenosed.  Prox RCA to Mid RCA lesion is 99% stenosed.  Post intervention, there is a 0% residual stenosis.  A drug-eluting stent was successfully placed using a STENT SYNERGY DES 3.5X16.  LV end diastolic pressure is normal.   1. Single  vessel obstructive CAD.     - prior stent in the mid LAD with 30% in stent disease    - 99% mid RCA 2. Normal LVEDP 3. Successful PCI of the mid RCA with DES x 1.    Echo 02/10/18: Study Conclusions - Left ventricle: The cavity size was normal. Wall thickness was   increased in a pattern of moderate LVH. Systolic function was   vigorous. The estimated ejection fraction was in the range of 65%   to 70%. Wall motion was normal; there were no regional wall   motion abnormalities. Doppler parameters are consistent with   abnormal left ventricular relaxation (grade 1 diastolic   dysfunction). - Aortic root: The aortic root was mildly dilated. - Mitral valve: Calcified annulus.  Impressions: - Vigorous LV systolic function; moderate LVH; mild diastolic   dysfunction; mildly dilated aortic root.   EKG:  EKG is ordered today.  The ekg ordered today demonstrates sinus rhythm, unchanged from prior  Recent Labs: 03/07/2017: B Natriuretic Peptide 10.8; Magnesium 2.0 12/18/2017: ALT 28 02/11/2018: BUN 12; Creatinine, Ser 0.98; Hemoglobin 12.7; Platelets 164; Potassium 4.0; Sodium 140  Recent Lipid Panel    Component Value Date/Time   CHOL 127 09/17/2017 1538   TRIG 155 (H) 09/17/2017 1538   HDL 31 (L) 09/17/2017 1538   CHOLHDL 4.1 09/17/2017 1538   LDLCALC 65 09/17/2017 1538    Physical Exam:    VS:  BP 130/80 (BP Location: Left Arm, Patient Position: Sitting)   Pulse 81   Ht 6' (1.829 m)   Wt 230 lb (104.3 kg)   BMI 31.19 kg/m     Wt Readings from Last 3 Encounters:  02/19/18 230 lb (104.3 kg)  02/11/18 227 lb 8.2 oz (103.2 kg)  02/04/18 226 lb (102.5 kg)     GEN: Well nourished, well developed in no acute distress, blind with walking stick HEENT: Normal NECK: No JVD; No carotid bruits LYMPHATICS: No lymphadenopathy CARDIAC: RRR, no murmurs, rubs, gallops RESPIRATORY:  Clear to  auscultation without rales, wheezing or rhonchi, mildly diminished in bases ABDOMEN: Soft,  non-tender, non-distended MUSCULOSKELETAL:  No edema; No deformity  SKIN: Warm and dry NEUROLOGIC:  Alert and oriented x 3 PSYCHIATRIC:  Normal affect   ASSESSMENT:    1. NSTEMI (non-ST elevated myocardial infarction) ([place])   2. S/P angioplasty with stent   3. Essential hypertension   4. Mixed hyperlipidemia   5. Former smoker    PLAN:    In order of problems listed above:  NSTEMI (non-ST elevated myocardial infarction) ([place]) S/P angioplasty with stent DES to RCA. Doing well on ASA and brilinta. He is having some SOB with brilinta that is improved with small doses of caffeine. Right radial site C/D/I. No medication changes.   Essential hypertension Pressure well-controlled. On lisinopril and toprol.   Mixed hyperlipidemia 09/17/2017: Cholesterol, Total 127; HDL 31; LDL Calculated 65; Triglycerides 155 Continue lipitor 80 mg. Repeat fasting lipids and LFTs at 3 moth follow up.   Former smoker He quit 2 months ago. Congratulated him on his success.    Follow up with Dr. Stanford Breed in 3 months.    Medication Adjustments/Labs and Tests Ordered: Current medicines are reviewed at length with the patient today.  Concerns regarding medicines are outlined above.  Orders Placed This Encounter  Procedures  . CBC  . EKG 12-Lead   No orders of the defined types were placed in this encounter.   Signed, Ledora Bottcher, [place]  02/19/2018 9:43 AM    [facility]

## 2018-02-19 ENCOUNTER — Ambulatory Visit: Payer: Medicare Other

## 2018-02-19 ENCOUNTER — Ambulatory Visit (INDEPENDENT_AMBULATORY_CARE_PROVIDER_SITE_OTHER): Payer: Medicare Other | Admitting: Physician Assistant

## 2018-02-19 ENCOUNTER — Encounter: Payer: Self-pay | Admitting: Physician Assistant

## 2018-02-19 VITALS — BP 130/80 | HR 81 | Ht 72.0 in | Wt 230.0 lb

## 2018-02-19 DIAGNOSIS — I214 Non-ST elevation (NSTEMI) myocardial infarction: Secondary | ICD-10-CM | POA: Diagnosis not present

## 2018-02-19 DIAGNOSIS — Z95820 Peripheral vascular angioplasty status with implants and grafts: Secondary | ICD-10-CM

## 2018-02-19 DIAGNOSIS — I10 Essential (primary) hypertension: Secondary | ICD-10-CM

## 2018-02-19 DIAGNOSIS — E782 Mixed hyperlipidemia: Secondary | ICD-10-CM

## 2018-02-19 DIAGNOSIS — Z87891 Personal history of nicotine dependence: Secondary | ICD-10-CM

## 2018-02-19 LAB — CBC
HEMATOCRIT: 39.7 % (ref 37.5–51.0)
Hemoglobin: 13.8 g/dL (ref 13.0–17.7)
MCH: 31.7 pg (ref 26.6–33.0)
MCHC: 34.8 g/dL (ref 31.5–35.7)
MCV: 91 fL (ref 79–97)
PLATELETS: 230 10*3/uL (ref 150–450)
RBC: 4.36 x10E6/uL (ref 4.14–5.80)
RDW: 14.1 % (ref 12.3–15.4)
WBC: 9.4 10*3/uL (ref 3.4–10.8)

## 2018-02-19 NOTE — Patient Instructions (Signed)
Medication Instructions:  Your physician recommends that you continue on your current medications as directed. Please refer to the Current Medication list given to you today.   Labwork: TODAY:  CBC  Testing/Procedures: None ordered  Follow-Up: Your physician recommends that you schedule a follow-up appointment in: 3 MONTHS WITH DR. CRENSHAW   Any Other Special Instructions Will Be Listed Below (If Applicable).     If you need a refill on your cardiac medications before your next appointment, please call your pharmacy.

## 2018-02-20 ENCOUNTER — Ambulatory Visit: Payer: Medicare Other

## 2018-02-23 ENCOUNTER — Telehealth (HOSPITAL_COMMUNITY): Payer: Self-pay

## 2018-02-23 ENCOUNTER — Ambulatory Visit: Payer: Medicare Other

## 2018-02-23 NOTE — Telephone Encounter (Signed)
Attempted to contact patient to schedule Cardiac Rehab - lm on vm

## 2018-02-24 ENCOUNTER — Other Ambulatory Visit (INDEPENDENT_AMBULATORY_CARE_PROVIDER_SITE_OTHER): Payer: Self-pay | Admitting: Physician Assistant

## 2018-02-24 ENCOUNTER — Ambulatory Visit: Payer: Medicare Other

## 2018-02-24 DIAGNOSIS — Z760 Encounter for issue of repeat prescription: Secondary | ICD-10-CM

## 2018-02-24 NOTE — Telephone Encounter (Signed)
FWD to PCP. Tempestt S Roberts, CMA  

## 2018-02-25 ENCOUNTER — Ambulatory Visit: Payer: Medicare Other

## 2018-02-25 ENCOUNTER — Ambulatory Visit (INDEPENDENT_AMBULATORY_CARE_PROVIDER_SITE_OTHER): Payer: Medicare Other | Admitting: Physician Assistant

## 2018-02-25 ENCOUNTER — Other Ambulatory Visit: Payer: Self-pay

## 2018-02-25 ENCOUNTER — Encounter

## 2018-02-25 ENCOUNTER — Encounter (INDEPENDENT_AMBULATORY_CARE_PROVIDER_SITE_OTHER): Payer: Self-pay | Admitting: Physician Assistant

## 2018-02-25 VITALS — BP 103/71 | HR 70 | Temp 97.9°F | Ht 72.0 in | Wt 229.8 lb

## 2018-02-25 DIAGNOSIS — I2510 Atherosclerotic heart disease of native coronary artery without angina pectoris: Secondary | ICD-10-CM | POA: Diagnosis not present

## 2018-02-25 DIAGNOSIS — K0889 Other specified disorders of teeth and supporting structures: Secondary | ICD-10-CM

## 2018-02-25 DIAGNOSIS — F411 Generalized anxiety disorder: Secondary | ICD-10-CM | POA: Diagnosis not present

## 2018-02-25 MED ORDER — ACETAMINOPHEN-CODEINE #3 300-30 MG PO TABS: 1.0000 | ORAL_TABLET | Freq: Two times a day (BID) | ORAL | 0 refills | Status: AC | PRN

## 2018-02-25 MED ORDER — HYDROXYZINE HCL 10 MG PO TABS: 10.0000 mg | ORAL_TABLET | Freq: Two times a day (BID) | ORAL | 5 refills | Status: DC

## 2018-02-25 MED ORDER — AMOXICILLIN-POT CLAVULANATE 875-125 MG PO TABS: 1.0000 | ORAL_TABLET | Freq: Two times a day (BID) | ORAL | 5 refills | Status: DC

## 2018-02-25 NOTE — Patient Instructions (Signed)
Dental Abscess A dental abscess is pus in or around a tooth. Follow these instructions at home:  Take medicines only as told by your dentist.  If you were prescribed antibiotic medicine, finish all of it even if you start to feel better.  Rinse your mouth (gargle) often with salt water.  Do not drive or use heavy machinery, like a lawn mower, while taking pain medicine.  Do not apply heat to the outside of your mouth.  Keep all follow-up visits as told by your dentist. This is important. Contact a doctor if:  Your pain is worse, and medicine does not help. Get help right away if:  You have a fever or chills.  Your symptoms suddenly get worse.  You have a very bad headache.  You have problems breathing or swallowing.  You have trouble opening your mouth.  You have puffiness (swelling) in your neck or around your eye. This information is not intended to replace advice given to you by your health care provider. Make sure you discuss any questions you have with your health care provider. Document Released: 11/29/2014 Document Revised: 12/21/2015 Document Reviewed: 07/12/2014 Elsevier Interactive Patient Education  2018 [employer] Inc[employer]  

## 2018-02-25 NOTE — Progress Notes (Signed)
Subjective:  Patient ID: Stephen Stone, male    DOB: 31-Aug-1955  Age: 62 y.o. MRN: [id number]  CC: dental pain  HPI  Stephen Evanoffis a 62 y.o.malewith a medical history of blindness, CAD, PCI s/p stent 09/2102,Gleason6 prostate cancer, and tobacco abuse presents to discuss tooth pain and poor dentition. Would like a refill of antibiotics and pain medication since he is still unable to see the dentist. Takes antibiotics when he notices suppuration from his teeth. Has recently suffered a NSTEMI and underwent PCI with DES placement to RCA on 02/10/18. Has been to cardiology follow up and told he should not have any dental procedures until he is cleared by cardiology. Patient also states he feels very irritable and impatient when dealing with his oncologist. Says he has noted he is "on edge" and reacts harshly and quickly to stressors or perceived inadequacies by his providers. Does not believe he is depressed. PHQ9 zero and GAD7 8 in clinic today. Takes seroquel prescribed by non-psychiatric outside provider to help treat his anxiety. Says seroquel has improved his sleep but does not want to be "zombified". Wants to be able to perform his daily tasks with a clear mind. Does not endorse any other symptoms or complaints.    Outpatient Medications Prior to Visit  Medication Sig Dispense Refill  . aspirin EC 81 MG tablet Take 81 mg by mouth daily.    Marland Kitchen atorvastatin (LIPITOR) 80 MG tablet TAKE 1 TABLET BY MOUTH ONCE DAILY IN THE EVENING 90 tablet 3  . lisinopril (PRINIVIL,ZESTRIL) 20 MG tablet TAKE 1 TABLET BY MOUTH ONCE DAILY 90 tablet 3  . metoprolol succinate (TOPROL-XL) 100 MG 24 hr tablet TAKE 1 TABLET BY MOUTH ONCE DAILY (TAKE WITH OR  IMMEDIATELY  FOLLOWING  A  MEAL) 90 tablet 3  . nitroGLYCERIN (NITROSTAT) 0.4 MG SL tablet Place 1 tablet (0.4 mg total) under the tongue every 5 (five) minutes x 3 doses as needed for chest pain. 25 tablet 2  . QUEtiapine (SEROQUEL) 200 MG tablet TAKE 1  TABLET BY MOUTH AT BEDTIME 90 tablet 3  . ticagrelor (BRILINTA) 90 MG TABS tablet Take 1 tablet (90 mg total) by mouth 2 (two) times daily. 180 tablet 3  . acetaminophen (TYLENOL) 650 MG CR tablet Take 1,300 mg by mouth every 8 (eight) hours as needed for pain.    . ranitidine (ZANTAC) 150 MG tablet Take 1 tablet (150 mg total) by mouth 2 (two) times daily. 60 tablet 0  . traMADol (ULTRAM) 50 MG tablet Take 1 tablet (50 mg total) by mouth every 6 (six) hours as needed. (Patient not taking: Reported on 01/31/2018) 8 tablet 0   No facility-administered medications prior to visit.      ROS Review of Systems  Constitutional: Negative for chills, fever and malaise/fatigue.  HENT:       Tooth pain  Eyes: Negative for blurred vision.  Respiratory: Negative for shortness of breath.   Cardiovascular: Negative for chest pain and palpitations.  Gastrointestinal: Negative for abdominal pain and nausea.  Genitourinary: Negative for dysuria and hematuria.  Musculoskeletal: Negative for joint pain and myalgias.  Skin: Negative for rash.  Neurological: Negative for tingling and headaches.  Psychiatric/Behavioral: Negative for depression. The patient is nervous/anxious.     Objective:  BP 103/71 (BP Location: Left Arm, Patient Position: Sitting, Cuff Size: Normal)   Pulse 70   Temp 97.9 F (36.6 C) (Oral)   Ht 6' (1.829 m)   Wt 229 lb 12.8 oz (  104.2 kg)   SpO2 95%   BMI 31.17 kg/m   BP/Weight 02/25/2018 02/19/2018 0/96/2836  Systolic BP 629 476 546  Diastolic BP 71 80 78  Wt. (Lbs) 229.8 230 227.52  BMI 31.17 31.19 -      Physical Exam  Constitutional: He is oriented to person, place, and time.  Well developed, well nourished, NAD, polite  HENT:  Head: Normocephalic and atraumatic.  Eyes: No scleral icterus.  Neck: Normal range of motion. Neck supple. No thyromegaly present.  Cardiovascular: Normal rate, regular rhythm and normal heart sounds.  Pulmonary/Chest: Effort normal and  breath sounds normal.  Abdominal: Soft. Bowel sounds are normal. There is no tenderness.  Musculoskeletal: He exhibits no edema.  Neurological: He is alert and oriented to person, place, and time.  Skin: Skin is warm and dry. No rash noted. No erythema. No pallor.  Psychiatric: He has a normal mood and affect. His behavior is normal. Thought content normal.  Vitals reviewed.    Assessment & Plan:    1. Odontalgia - acetaminophen-codeine (TYLENOL #3) 300-30 MG tablet; Take 1 tablet by mouth every 12 (twelve) hours as needed for moderate pain.  Dispense: 60 tablet; Refill: 0 - amoxicillin-clavulanate (AUGMENTIN) 875-125 MG tablet; Take 1 tablet by mouth 2 (two) times daily.  Dispense: 20 tablet; Refill: 5  2. Generalized anxiety disorder - Begin hydrOXYzine (ATARAX/VISTARIL) 10 MG tablet; Take 1 tablet (10 mg total) by mouth 2 (two) times daily.  Dispense: 30 tablet; Refill: 5 - Will consider psychiatry appointment if GAD7 score worsens or if hydroxyzine fails to calm patient's anxiety and irritability.    Meds ordered this encounter  Medications  . acetaminophen-codeine (TYLENOL #3) 300-30 MG tablet    Sig: Take 1 tablet by mouth every 12 (twelve) hours as needed for moderate pain.    Dispense:  60 tablet    Refill:  0    Order Specific Question:   Supervising Provider    Answer:   Charlott Rakes [[id number]]  . amoxicillin-clavulanate (AUGMENTIN) 875-125 MG tablet    Sig: Take 1 tablet by mouth 2 (two) times daily.    Dispense:  20 tablet    Refill:  5    Order Specific Question:   Supervising Provider    Answer:   Charlott Rakes [[id number]]  . hydrOXYzine (ATARAX/VISTARIL) 10 MG tablet    Sig: Take 1 tablet (10 mg total) by mouth 2 (two) times daily.    Dispense:  30 tablet    Refill:  5    Order Specific Question:   Supervising Provider    Answer:   Charlott Rakes [[id number]]    Follow-up: Return in about 1 month (around 03/25/2018) for anxiety.   [name]  PA

## 2018-02-26 ENCOUNTER — Ambulatory Visit: Payer: Medicare Other

## 2018-02-27 ENCOUNTER — Ambulatory Visit: Payer: Medicare Other | Attending: Radiation Oncology

## 2018-03-02 ENCOUNTER — Telehealth (HOSPITAL_COMMUNITY): Payer: Self-pay

## 2018-03-02 ENCOUNTER — Ambulatory Visit: Payer: Medicare Other

## 2018-03-02 NOTE — Telephone Encounter (Signed)
2nd attempted to contact patient in regards to Cardiac Rehab - lm on vm. Sending letter.

## 2018-03-03 ENCOUNTER — Ambulatory Visit: Payer: Medicare Other

## 2018-03-04 ENCOUNTER — Ambulatory Visit: Payer: Medicare Other

## 2018-03-05 ENCOUNTER — Ambulatory Visit: Payer: Medicare Other

## 2018-03-06 ENCOUNTER — Ambulatory Visit: Payer: Medicare Other

## 2018-03-09 ENCOUNTER — Ambulatory Visit: Payer: Medicare Other

## 2018-03-10 ENCOUNTER — Ambulatory Visit: Payer: Medicare Other

## 2018-03-11 ENCOUNTER — Telehealth (HOSPITAL_COMMUNITY): Payer: Self-pay

## 2018-03-11 ENCOUNTER — Ambulatory Visit: Payer: Medicare Other

## 2018-03-11 NOTE — Telephone Encounter (Signed)
Called patient to see if he was interested in participating in the Cardiac Rehab Program. Patient stated yes. Patient will come in for orientation on 05/05/18 @ 8:15AM and will attend the 9:45AM exercise class.  Mailed homework package.

## 2018-03-12 ENCOUNTER — Ambulatory Visit: Payer: Medicare Other

## 2018-03-13 ENCOUNTER — Ambulatory Visit: Payer: Medicare Other

## 2018-03-15 ENCOUNTER — Ambulatory Visit: Admission: RE | Admit: 2018-03-15 | Payer: Medicare Other | Source: Ambulatory Visit

## 2018-03-16 ENCOUNTER — Ambulatory Visit: Payer: Medicare Other

## 2018-03-17 ENCOUNTER — Ambulatory Visit: Payer: Medicare Other

## 2018-03-18 ENCOUNTER — Ambulatory Visit: Payer: Medicare Other

## 2018-03-19 ENCOUNTER — Ambulatory Visit: Payer: Medicare Other

## 2018-03-20 ENCOUNTER — Ambulatory Visit: Payer: Medicare Other

## 2018-03-23 ENCOUNTER — Ambulatory Visit: Payer: Medicare Other

## 2018-03-24 ENCOUNTER — Ambulatory Visit: Payer: Medicare Other

## 2018-03-25 ENCOUNTER — Ambulatory Visit (INDEPENDENT_AMBULATORY_CARE_PROVIDER_SITE_OTHER): Payer: Medicare Other | Admitting: Physician Assistant

## 2018-03-26 ENCOUNTER — Telehealth: Payer: Self-pay | Admitting: Radiation Oncology

## 2018-03-26 ENCOUNTER — Ambulatory Visit: Payer: Medicare Other

## 2018-03-26 NOTE — Telephone Encounter (Signed)
Phoned patient and his daughter, [name], to inquire about his current state and desire to move forward with radiation. No answer at either number but left a message on both with my contact information.

## 2018-03-27 ENCOUNTER — Ambulatory Visit: Payer: Medicare Other

## 2018-04-09 NOTE — Progress Notes (Signed)
  Radiation Oncology         [phone] ________________________________  Name: Stephen Stone MRN: [id number]  Date: 02/09/2018  DOB: August 31, 1955  End of Treatment Note  Diagnosis:   62 y.o. male with Stage T1c adenocarcinoma of the prostate with Gleason Score of 3+4, and PSA of 25.20     Indication for treatment:  Curative, Definitive Radiotherapy       Radiation treatment dates:   02/02/2018 - 02/09/2018  Site/Planned dose:   Prostate / 45 Gy in 25 fractions, to supplement an up-front prostate seed implant boost of 110 Gy to achieve a total nominal dose of 155 Gy.  The prostate was treated to 9 Gy in 5 fractions of 1.8 Gy per patient decision, therefore patient only received a total nominal dose of 119 Gy.  Beams/energy:   The patient was treated with IMRT using volumetric arc therapy delivering 6 MV X-rays to clockwise and counterclockwise circumferential arcs with a 90 degree collimator offset to avoid dose scalloping.  Image guidance was performed with daily cone beam CT prior to each fraction to align to gold markers in the prostate and assure proper bladder and rectal fill volumes.  Immobilization was achieved with BodyFix custom mold.  Narrative: The patient tolerated radiation treatment relatively well without any difficulties.  However, unfortunately, the patient had a cardiac event following his 5th treatment, requiring extended hospitalization and cardiac catheterization. Upon discharge home, the patient made the decision to discontinue his radiotherapy AMA and despite multiple phone calls from our team encouraging him to continue treatment.  Plan: The patient has elected to discontinue his radiotherapy prior to completion.  At this point, he will follow-up on an as-needed basis but knows to call us with any questions or concerns related to his recovery or treatment. ________________________________  Sheral Apley. Tammi Klippel, M.D.  This document serves as a record of services  personally performed by Tyler Pita, MD. It was created on his behalf by Rae Lips, a trained medical scribe. The creation of this record is based on the scribe's personal observations and the provider's statements to them. This document has been checked and approved by the attending provider.

## 2018-04-28 ENCOUNTER — Telehealth (HOSPITAL_COMMUNITY): Payer: Self-pay | Admitting: Pharmacist

## 2018-05-01 ENCOUNTER — Telehealth (HOSPITAL_COMMUNITY): Payer: Self-pay

## 2018-05-01 NOTE — Telephone Encounter (Signed)
Patient wishes to cancel appointments at this time as he would like to see Cardiologist on the 31st. Placed paperwork in "Ready to schedule folder" and will follow up after appointment.

## 2018-05-04 ENCOUNTER — Encounter (INDEPENDENT_AMBULATORY_CARE_PROVIDER_SITE_OTHER): Payer: Self-pay | Admitting: Physician Assistant

## 2018-05-04 ENCOUNTER — Other Ambulatory Visit: Payer: Self-pay

## 2018-05-04 ENCOUNTER — Ambulatory Visit (INDEPENDENT_AMBULATORY_CARE_PROVIDER_SITE_OTHER): Payer: Medicare Other | Admitting: Physician Assistant

## 2018-05-04 VITALS — BP 109/66 | HR 68 | Temp 97.9°F | Ht 72.0 in | Wt 230.0 lb

## 2018-05-04 DIAGNOSIS — F418 Other specified anxiety disorders: Secondary | ICD-10-CM | POA: Diagnosis not present

## 2018-05-04 DIAGNOSIS — K089 Disorder of teeth and supporting structures, unspecified: Secondary | ICD-10-CM | POA: Diagnosis not present

## 2018-05-04 DIAGNOSIS — Z1211 Encounter for screening for malignant neoplasm of colon: Secondary | ICD-10-CM

## 2018-05-04 DIAGNOSIS — G8929 Other chronic pain: Secondary | ICD-10-CM

## 2018-05-04 DIAGNOSIS — C61 Malignant neoplasm of prostate: Secondary | ICD-10-CM | POA: Diagnosis not present

## 2018-05-04 DIAGNOSIS — I2510 Atherosclerotic heart disease of native coronary artery without angina pectoris: Secondary | ICD-10-CM

## 2018-05-04 MED ORDER — ACETAMINOPHEN 500 MG PO TABS: 1000.0000 mg | ORAL_TABLET | Freq: Three times a day (TID) | ORAL | 0 refills | Status: AC | PRN

## 2018-05-04 MED ORDER — ALPRAZOLAM 0.5 MG PO TABS: 0.5000 mg | ORAL_TABLET | Freq: Two times a day (BID) | ORAL | 0 refills | Status: DC | PRN

## 2018-05-04 NOTE — Patient Instructions (Signed)
Psychiatry and Counselor Information  Patient is responsible for scheduling their initial consult with one of the following:  [facility]-[phone] [address] [place], [place] [address]  [facility]-[phone] [address] [place], [place] [address]  [facility]-[phone] [address] [place], [place] [address]  [facility]-[phone] [place]   

## 2018-05-04 NOTE — Progress Notes (Signed)
Subjective:  Patient ID: Stephen Stone, male    DOB: 10-May-1956  Age: 62 y.o. MRN: [id number]  CC: anxiety  HPI Stephen Evanoffis a 62 y.o.malewith a medical history of blindness, CAD, PCI s/p stent 09/2102 and 02/10/18,Gleason6 prostate cancer, and tobacco abuse presents to discuss anxiety. Has taken hydroxyzine 10 mg but is unsure if his anxiety is any better. Says he started smoking again, possibly due to boredom but is unsure if attributed to anxiety. Has nearly smoked one pack. Recognizes smoking is harmful to him. Says he does not have a worry in the world and he is being taken care of by his daughter. Plans to go to a senior center to help him become more active and less anxious. Used to see a psychiatrist and was prescribed Xanax. He is currently taking Seroquel 200 mg qhs for insomnia. Pt does not feel depressed but says he worries in excess and is often in "combat mode" due to his blindness.     Pt was to begin cardiac rehab tomorrow morning but says he was told by the nurse at cardiac rehab that the exercises he does at home is sufficient for him. Does not endorse any cardiovascular symptoms.     Pt has chronic dental pain. Says he does not want to go to [facility] pain clinic because it is a hassle for him in his state of blindness to go to pain clinic. However, he is willing to go to Dr Primus Bravo whom has a pain clinic near him. Says he was told he can not have dental procedures done because of his use of Plavix for his NSTEMI.     Outpatient Medications Prior to Visit  Medication Sig Dispense Refill  . acetaminophen (TYLENOL) 650 MG CR tablet Take 1,300 mg by mouth every 8 (eight) hours as needed for pain.    Marland Kitchen amoxicillin-clavulanate (AUGMENTIN) 875-125 MG tablet Take 1 tablet by mouth 2 (two) times daily. 20 tablet 5  . aspirin EC 81 MG tablet Take 81 mg by mouth daily.    Marland Kitchen atorvastatin (LIPITOR) 80 MG tablet TAKE 1 TABLET BY MOUTH ONCE DAILY IN THE EVENING 90 tablet 3  .  hydrOXYzine (ATARAX/VISTARIL) 10 MG tablet Take 1 tablet (10 mg total) by mouth 2 (two) times daily. 30 tablet 5  . lisinopril (PRINIVIL,ZESTRIL) 20 MG tablet TAKE 1 TABLET BY MOUTH ONCE DAILY 90 tablet 3  . metoprolol succinate (TOPROL-XL) 100 MG 24 hr tablet TAKE 1 TABLET BY MOUTH ONCE DAILY (TAKE WITH OR  IMMEDIATELY  FOLLOWING  A  MEAL) 90 tablet 3  . nitroGLYCERIN (NITROSTAT) 0.4 MG SL tablet Place 1 tablet (0.4 mg total) under the tongue every 5 (five) minutes x 3 doses as needed for chest pain. 25 tablet 2  . QUEtiapine (SEROQUEL) 200 MG tablet TAKE 1 TABLET BY MOUTH AT BEDTIME 90 tablet 3  . ticagrelor (BRILINTA) 90 MG TABS tablet Take 1 tablet (90 mg total) by mouth 2 (two) times daily. 180 tablet 3   No facility-administered medications prior to visit.      ROS Review of Systems  Constitutional: Negative for chills, fever and malaise/fatigue.  HENT:       Pain in several teeth  Eyes: Negative for blurred vision.  Respiratory: Negative for shortness of breath.   Cardiovascular: Negative for chest pain and palpitations.  Gastrointestinal: Negative for abdominal pain and nausea.  Genitourinary: Negative for dysuria and hematuria.  Musculoskeletal: Negative for joint pain and myalgias.  Skin: Negative for  rash.  Neurological: Negative for tingling and headaches.  Psychiatric/Behavioral: Negative for depression. The patient is nervous/anxious.     Objective:  There were no vitals taken for this visit.  BP/Weight 02/25/2018 02/19/2018 03/08/8866  Systolic BP 737 366 815  Diastolic BP 71 80 78  Wt. (Lbs) 229.8 230 227.52  BMI 31.17 31.19 -      Physical Exam  Constitutional: He is oriented to person, place, and time.  Well developed, well nourished, NAD, polite  HENT:  Head: Normocephalic and atraumatic.  Severely decayed teeth with several missing teeth. No current abscess, bleeding, suppuration, facial swelling, tongue swelling, or throat swelling  Eyes: No scleral  icterus.  Neck: Normal range of motion. Neck supple. No thyromegaly present.  Cardiovascular: Normal rate, regular rhythm and normal heart sounds.  Pulmonary/Chest: Effort normal and breath sounds normal.  Abdominal: Soft. Bowel sounds are normal. There is no tenderness.  Musculoskeletal: He exhibits no edema.  Neurological: He is alert and oriented to person, place, and time.  Skin: Skin is warm and dry. No rash noted. No erythema. No pallor.  Psychiatric: He has a normal mood and affect. His behavior is normal. Thought content normal.  Vitals reviewed.    Assessment & Plan:    1. Depression with anxiety - Ambulatory referral to Psychiatry. Pt's vulnerability of blindness and severe medical conditions seem to drive his anxiety. Pt will need much psychological support and social support.  - Begin Xanax 0.5 mg one tab po BID PRN, x30 days, #60  2. Chronic dental pain - Ambulatory referral to Pain Clinic  3. Screening for colon cancer - Fecal occult blood, imunochemical; Future  4. Prostate cancer ([place]) - PSA   Meds ordered this encounter  Medications  . ALPRAZolam (XANAX) 0.5 MG tablet    Sig: Take 1 tablet (0.5 mg total) by mouth 2 (two) times daily as needed for anxiety.    Dispense:  60 tablet    Refill:  0    Generic if available.    Order Specific Question:   Supervising Provider    Answer:   Charlott Rakes [[id number]]  . acetaminophen (TYLENOL) 500 MG tablet    Sig: Take 2 tablets (1,000 mg total) by mouth every 8 (eight) hours as needed for up to 7 days.    Dispense:  21 tablet    Refill:  0    Order Specific Question:   Supervising Provider    Answer:   Charlott Rakes [[id number]]    Follow-up: Return in about 12 weeks (around 07/27/2018).   [name] PA

## 2018-05-05 ENCOUNTER — Telehealth (INDEPENDENT_AMBULATORY_CARE_PROVIDER_SITE_OTHER): Payer: Self-pay

## 2018-05-05 ENCOUNTER — Ambulatory Visit (HOSPITAL_COMMUNITY): Payer: Medicare Other

## 2018-05-05 LAB — PSA: PROSTATE SPECIFIC AG, SERUM: 1.7 ng/mL (ref 0.0–4.0)

## 2018-05-05 NOTE — Telephone Encounter (Signed)
Patient is aware that PSA is down to within normal limits. Nat Christen, CMA

## 2018-05-05 NOTE — Telephone Encounter (Signed)
-----   Message from [name], PA-C sent at 05/05/2018  1:04 PM EDT ----- PSA is down to within normal limits.

## 2018-05-11 ENCOUNTER — Ambulatory Visit (HOSPITAL_COMMUNITY): Payer: Medicare Other

## 2018-05-13 ENCOUNTER — Ambulatory Visit (HOSPITAL_COMMUNITY): Payer: Medicare Other

## 2018-05-15 ENCOUNTER — Ambulatory Visit (HOSPITAL_COMMUNITY): Payer: Medicare Other

## 2018-05-18 ENCOUNTER — Ambulatory Visit (HOSPITAL_COMMUNITY): Payer: Medicare Other

## 2018-05-20 ENCOUNTER — Ambulatory Visit (HOSPITAL_COMMUNITY): Payer: Medicare Other

## 2018-05-22 ENCOUNTER — Ambulatory Visit (HOSPITAL_COMMUNITY): Payer: Medicare Other

## 2018-05-25 ENCOUNTER — Ambulatory Visit (HOSPITAL_COMMUNITY): Payer: Medicare Other

## 2018-05-25 NOTE — Progress Notes (Signed)
HPI: FU CAD. Patient has had previous PCI in [place] (LAD).  Cardiac catheterization July 2019 showed a 30% proximal LAD, 40% ostial first marginal, 99% proximal to mid RCA.  Patient had PCI of his right coronary artery with drug-eluting stent.  Echocardiogram July 2019 showed vigorous LV function, moderate left ventricular hypertrophy, mild diastolic dysfunction and mildly dilated aortic root.  Since last seen there is no dyspnea, chest pain, palpitations or syncope.  Current Outpatient Medications  Medication Sig Dispense Refill  . ALPRAZolam (XANAX) 0.5 MG tablet Take 1 tablet (0.5 mg total) by mouth 2 (two) times daily as needed for anxiety. 60 tablet 0  . amoxicillin-clavulanate (AUGMENTIN) 875-125 MG tablet Take 1 tablet by mouth 2 (two) times daily. 20 tablet 5  . aspirin EC 81 MG tablet Take 81 mg by mouth daily.    Marland Kitchen atorvastatin (LIPITOR) 80 MG tablet TAKE 1 TABLET BY MOUTH ONCE DAILY IN THE EVENING 90 tablet 3  . lisinopril (PRINIVIL,ZESTRIL) 20 MG tablet TAKE 1 TABLET BY MOUTH ONCE DAILY 90 tablet 3  . metoprolol succinate (TOPROL-XL) 100 MG 24 hr tablet TAKE 1 TABLET BY MOUTH ONCE DAILY (TAKE WITH OR  IMMEDIATELY  FOLLOWING  A  MEAL) 90 tablet 3  . nitroGLYCERIN (NITROSTAT) 0.4 MG SL tablet Place 1 tablet (0.4 mg total) under the tongue every 5 (five) minutes x 3 doses as needed for chest pain. 25 tablet 2  . QUEtiapine (SEROQUEL) 200 MG tablet TAKE 1 TABLET BY MOUTH AT BEDTIME 90 tablet 3  . ticagrelor (BRILINTA) 90 MG TABS tablet Take 1 tablet (90 mg total) by mouth 2 (two) times daily. 180 tablet 3   No current facility-administered medications for this visit.      Past Medical History:  Diagnosis Date  . Anxiety   . Coronary artery disease cardioloigst-  dr Stanford Breed--- previously cardiologist from [facility]   hx anterior wall STEMI,  10-05-2011  cardiac cath w/ PCI and BM stent x1 to distal LAD  . Depression   . Essential hypertension   . Glaucoma of  both eyes    CAUSED LEGAL BLINDINESS BILATERAL  . Hyperlipidemia   . 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 NSTEMI (non-ST elevated myocardial infarction) ([place]) 02/09/2018  . Prostate cancer [facility]) urologist-  dr wrenn/  onologist-  dr Tammi Klippel   dx Nov 2017 , Gleason 6, in [place]--  and repeat bx 08-19-2017 ,  Stage T1c, Gleason 3+4, PSA 25.20, vol 47cc-- scheduled for radioactive seed implants 12-25-2017  . STEMI (ST elevation myocardial infarction) ([place]) 10/05/2011    [place], [place]   acute anterior wall--- s/p  Cardiac cath w/ PCI and stent to distal LAD    Past Surgical History:  Procedure Laterality Date  . CARDIOVASCULAR STRESS TEST  04-23-2017   dr Stanford Breed   Low Risk Nuclear perfusion study w/ no evidence ischemia/  normal LV function and wall motion,  nuclear stress ef 58%  . CORONARY ANGIOPLASTY WITH STENT PLACEMENT  10-05-2011   [facility] in [place], [place] (records scanned in epic)   in setting anterior STEMI-- PCI and BM (Vision) stent x1 to distal LAD normal LVEF 60% with hypokinesis in the mid portion tf the anterolateral wall (30% mLAD,  30% CFx , 95% dLAD)  . CORONARY STENT INTERVENTION N/A 02/10/2018   Procedure: CORONARY STENT INTERVENTION;  Surgeon: Martinique, Peter M, MD;  Location:  [facility];  Service: Cardiovascular;  Laterality: N/A;  . CYSTOSCOPY N/A 08/19/2017   Procedure: CYSTOSCOPY;  Surgeon: [name], MD;  Location[facility];  Service: Urology;  Laterality: N/A;  . CYSTOSCOPY  12/25/2017   Procedure: CYSTOSCOPY;  Surgeon: [name], MD;  Location[facility];  Service: Urology;;  no seeds noted in bladder  . EYE SURGERY Bilateral    "for the glaucoma; still went blind after 2-3 operations on each eye" (02/10/2018)  . LEFT HEART CATH AND CORONARY ANGIOGRAPHY N/A 02/10/2018   Procedure: LEFT HEART CATH AND CORONARY ANGIOGRAPHY;  Surgeon: Martinique, Peter M, MD;   Location: [facility];  Service: Cardiovascular;  Laterality: N/A;  . PROSTATE BIOPSY N/A 08/19/2017   Procedure: BIOPSY TRANSRECTAL ULTRASONIC PROSTATE (TUBP);  Surgeon: [name], MD;  Location: [facility];  Service: Urology;  Laterality: N/A;  . RADIOACTIVE SEED IMPLANT N/A 12/25/2017   Procedure: RADIOACTIVE SEED IMPLANT/BRACHYTHERAPY IMPLANT;  Surgeon: [name], MD;  Location[facility];  Service: Urology;  Laterality: N/A;  77 seeds implanted  . SPACE OAR INSTILLATION N/A 12/25/2017   Procedure: SPACE OAR INSTILLATION;  Surgeon: [name], MD;  Location: [facility];  Service: Urology;  Laterality: N/A;  . TRANSTHORACIC ECHOCARDIOGRAM  03-21-2016    Dr Ronal [facility])   mild concentric LVH, ef 66-06%, grade 1 diastolic dysfunction/ mild PR/ trace TR    Social History   Socioeconomic History  . Marital status: Divorced    Spouse name: Not on file  . Number of children: 7  . Years of education: Not on file  . Highest education level: Not on file  Occupational History  . Not on file  Social Needs  . Financial resource strain: Not on file  . Food insecurity:    Worry: Not on file    Inability: Not on file  . Transportation needs:    Medical: Not on file    Non-medical: Not on file  Tobacco Use  . Smoking status: Former Smoker    Packs/day: 0.25    Years: 6.00    Pack years: 1.50    Types: Cigarettes    Last attempt to quit: 12/27/2017    Years since quitting: 0.4  . Smokeless tobacco: Never Used  Substance and Sexual Activity  . Alcohol use: Not Currently  . Drug use: Not Currently    Types: Marijuana    Comment: 02/10/2018 "in my 20's"  . Sexual activity: Yes  Lifestyle  . Physical activity:    Days per week: Not on file    Minutes per session: Not on file  . Stress: Not on file  Relationships  . Social connections:    Talks on phone: Not on file    Gets together: Not on file    Attends religious  service: Not on file    Active member of club or organization: Not on file    Attends meetings of clubs or organizations: Not on file    Relationship status: Not on file  . Intimate partner violence:    Fear of current or ex partner: Not on file    Emotionally abused: Not on file    Physically abused: Not on file    Forced sexual activity: Not on file  Other Topics Concern  . Not on file  Social History Narrative  . Not on file    Family History  Problem Relation Age of Onset  .  Cancer Mother        pancreatic  . CAD Father   . Cancer Brother        prostate  . Cancer Brother        prostate    ROS: no fevers or chills, productive cough, hemoptysis, dysphasia, odynophagia, melena, hematochezia, dysuria, hematuria, rash, seizure activity, orthopnea, PND, pedal edema, claudication. Remaining systems are negative.  Physical Exam: Well-developed well-nourished in no acute distress.  Skin is warm and dry.  HEENT  Right eye with discoloration; pt blind Neck is supple.  Chest is clear to auscultation with normal expansion.  Cardiovascular exam is regular rate and rhythm.  Abdominal exam nontender or distended. No masses palpated. Extremities show no edema. neuro grossly intact  A/P  1 coronary artery disease-patient doing well with no chest pain.  Plan to continue medical therapy with aspirin and statin.  Continue Brilinta until July 2020.  2 hypertension-patient's blood pressure is controlled.  Continue present medications.  3 hyperlipidemia-continue statin.  Check lipids and liver.  4 tobacco abuse-patient counseled on discontinuing.  [name], MD

## 2018-05-27 ENCOUNTER — Ambulatory Visit (HOSPITAL_COMMUNITY): Payer: Medicare Other

## 2018-05-28 ENCOUNTER — Ambulatory Visit (INDEPENDENT_AMBULATORY_CARE_PROVIDER_SITE_OTHER): Payer: Medicare Other | Admitting: Cardiology

## 2018-05-28 ENCOUNTER — Encounter: Payer: Self-pay | Admitting: Cardiology

## 2018-05-28 VITALS — BP 108/78 | HR 76 | Ht 72.0 in | Wt 235.0 lb

## 2018-05-28 DIAGNOSIS — I10 Essential (primary) hypertension: Secondary | ICD-10-CM

## 2018-05-28 DIAGNOSIS — E782 Mixed hyperlipidemia: Secondary | ICD-10-CM

## 2018-05-28 DIAGNOSIS — Z720 Tobacco use: Secondary | ICD-10-CM

## 2018-05-28 DIAGNOSIS — I214 Non-ST elevation (NSTEMI) myocardial infarction: Secondary | ICD-10-CM | POA: Diagnosis not present

## 2018-05-28 DIAGNOSIS — I2510 Atherosclerotic heart disease of native coronary artery without angina pectoris: Secondary | ICD-10-CM | POA: Diagnosis not present

## 2018-05-28 NOTE — Patient Instructions (Signed)
Medication Instructions:  Continue same medications If you need a refill on your cardiac medications before your next appointment, please call your pharmacy.   Lab work: Fasting lipid and liver panels If you have labs (blood work) drawn today and your tests are completely normal, you will receive your results only by: Marland Kitchen MyChart Message (if you have MyChart) OR . A paper copy in the mail If you have any lab test that is abnormal or we need to change your treatment, we will call you to review the results.  Testing/Procedures: None ordered  Follow-Up: At [facility], you and your health needs are our priority.  As part of our continuing mission to provide you with exceptional heart care, we have created designated Provider Care Teams.  These Care Teams include your primary Cardiologist (physician) and Advanced Practice Providers (APPs -  Physician Assistants and Nurse Practitioners) who all work together to provide you with the care you need, when you need it. . Follow Up with Dr.Crenshaw in 6 months call 2 months before to schedule

## 2018-05-29 ENCOUNTER — Ambulatory Visit (HOSPITAL_COMMUNITY): Payer: Medicare Other

## 2018-05-29 ENCOUNTER — Telehealth (HOSPITAL_COMMUNITY): Payer: Self-pay

## 2018-05-29 NOTE — Telephone Encounter (Signed)
Called and spoke with patient in regards to CR - pt stated he does not believe he needs Cardiac Rehab. He stated he is currently exc on his own. Closed referral.

## 2018-06-01 ENCOUNTER — Ambulatory Visit (HOSPITAL_COMMUNITY): Payer: Medicare Other

## 2018-06-03 ENCOUNTER — Ambulatory Visit (HOSPITAL_COMMUNITY): Payer: Medicare Other

## 2018-06-05 ENCOUNTER — Ambulatory Visit (HOSPITAL_COMMUNITY): Payer: Medicare Other

## 2018-06-08 ENCOUNTER — Ambulatory Visit (HOSPITAL_COMMUNITY): Payer: Medicare Other

## 2018-06-10 ENCOUNTER — Ambulatory Visit (HOSPITAL_COMMUNITY): Payer: Medicare Other

## 2018-06-12 ENCOUNTER — Ambulatory Visit (HOSPITAL_COMMUNITY): Payer: Medicare Other

## 2018-06-15 ENCOUNTER — Ambulatory Visit (HOSPITAL_COMMUNITY): Payer: Medicare Other

## 2018-06-17 ENCOUNTER — Ambulatory Visit (HOSPITAL_COMMUNITY): Payer: Medicare Other

## 2018-06-19 ENCOUNTER — Ambulatory Visit (HOSPITAL_COMMUNITY): Payer: Medicare Other

## 2018-06-22 ENCOUNTER — Encounter (INDEPENDENT_AMBULATORY_CARE_PROVIDER_SITE_OTHER): Payer: Self-pay | Admitting: Physician Assistant

## 2018-06-22 ENCOUNTER — Ambulatory Visit (HOSPITAL_COMMUNITY): Payer: Medicare Other

## 2018-06-22 ENCOUNTER — Ambulatory Visit (INDEPENDENT_AMBULATORY_CARE_PROVIDER_SITE_OTHER): Payer: Medicare Other | Admitting: Physician Assistant

## 2018-06-22 VITALS — BP 112/78 | HR 73 | Temp 97.7°F | Resp 18 | Ht 72.0 in | Wt 228.0 lb

## 2018-06-22 DIAGNOSIS — R300 Dysuria: Secondary | ICD-10-CM

## 2018-06-22 DIAGNOSIS — F418 Other specified anxiety disorders: Secondary | ICD-10-CM | POA: Diagnosis not present

## 2018-06-22 DIAGNOSIS — Z1211 Encounter for screening for malignant neoplasm of colon: Secondary | ICD-10-CM | POA: Diagnosis not present

## 2018-06-22 MED ORDER — ALPRAZOLAM 0.25 MG PO TABS
0.2500 mg | ORAL_TABLET | Freq: Two times a day (BID) | ORAL | 0 refills | Status: DC | PRN
Start: 2018-06-22 — End: 2018-10-19

## 2018-06-22 NOTE — Progress Notes (Signed)
Subjective:  Patient ID: Stephen Stone, male    DOB: September 30, 1955  Age: 62 y.o. MRN: [id number]  CC: Xanax refill  HPI  Stephen Evanoffis a 61 y.o.malewith a medical history of blindness, CAD, PCI s/p stent 09/2102 and 02/10/18,Gleason6 prostate cancer, and tobacco abuse presents to discuss anxiety. Has taken hydroxyzine 10 mg in the past but is unsure if his anxiety is any better. Says he does not have a worry in the world and he is being taken care of by his daughter. Yet he can become frustrated, irritated, and nervous easily. Pt says he worries in excess and is often in "combat mode" due to his blindness. Used to see a psychiatrist and was prescribed Xanax which helped his anxiety. He is currently taking Seroquel 200 mg qhs for insomnia. Referred to Psychiatry but refused appointment. According to the referral note, patient refused appointment because he would not be prescribed the medication he wanted (Xanax). However, he later accepted an appointment for January. Has been taking Xanax 0.5 mg PRN and he feels "great". Not nervous about his daily activities.     Taking Seroquel for insomnia. Says he had trouble sleeping due to seeing a constantly revolving film reel of all his past misfortunes and loss of loved ones. States Seroquel is highly effective and he no longer sees the "film reel".     Also has dysuria at the end of the urinary stream x2 weeks. Has not been sexually active in over two years.        Outpatient Medications Prior to Visit  Medication Sig Dispense Refill  . ALPRAZolam (XANAX) 0.5 MG tablet Take 1 tablet (0.5 mg total) by mouth 2 (two) times daily as needed for anxiety. 60 tablet 0  . amoxicillin-clavulanate (AUGMENTIN) 875-125 MG tablet Take 1 tablet by mouth 2 (two) times daily. 20 tablet 5  . aspirin EC 81 MG tablet Take 81 mg by mouth daily.    Marland Kitchen atorvastatin (LIPITOR) 80 MG tablet TAKE 1 TABLET BY MOUTH ONCE DAILY IN THE EVENING 90 tablet 3  . lisinopril  (PRINIVIL,ZESTRIL) 20 MG tablet TAKE 1 TABLET BY MOUTH ONCE DAILY 90 tablet 3  . metoprolol succinate (TOPROL-XL) 100 MG 24 hr tablet TAKE 1 TABLET BY MOUTH ONCE DAILY (TAKE WITH OR  IMMEDIATELY  FOLLOWING  A  MEAL) 90 tablet 3  . nitroGLYCERIN (NITROSTAT) 0.4 MG SL tablet Place 1 tablet (0.4 mg total) under the tongue every 5 (five) minutes x 3 doses as needed for chest pain. 25 tablet 2  . QUEtiapine (SEROQUEL) 200 MG tablet TAKE 1 TABLET BY MOUTH AT BEDTIME 90 tablet 3  . ticagrelor (BRILINTA) 90 MG TABS tablet Take 1 tablet (90 mg total) by mouth 2 (two) times daily. 180 tablet 3   No facility-administered medications prior to visit.      ROS Review of Systems  Constitutional: Negative for chills, fever and malaise/fatigue.  Eyes: Negative for blurred vision.  Respiratory: Negative for shortness of breath.   Cardiovascular: Negative for chest pain and palpitations.  Gastrointestinal: Negative for abdominal pain and nausea.  Genitourinary: Negative for dysuria and hematuria.  Musculoskeletal: Negative for joint pain and myalgias.  Skin: Negative for rash.  Neurological: Negative for tingling and headaches.  Psychiatric/Behavioral: Negative for depression. The patient is nervous/anxious.     Objective:  There were no vitals taken for this visit.  BP/Weight 05/28/2018 05/04/2018 0/34/7425  Systolic BP 956 387 564  Diastolic BP 78 66 71  Wt. (Lbs) 235  230 229.8  BMI 31.87 31.19 31.17      Physical Exam  Constitutional: He is oriented to person, place, and time.  Well developed, well nourished, NAD, polite, blind  HENT:  Head: Normocephalic and atraumatic.  Eyes: No scleral icterus.  Neck: Normal range of motion. Neck supple. No thyromegaly present.  Cardiovascular: Normal rate, regular rhythm and normal heart sounds.  Pulmonary/Chest: Effort normal and breath sounds normal.  Musculoskeletal: He exhibits no edema.  Neurological: He is alert and oriented to person, place,  and time.  Skin: Skin is warm and dry. No rash noted. No erythema. No pallor.  Psychiatric: He has a normal mood and affect. His behavior is normal. Thought content normal.  Vitals reviewed.    Assessment & Plan:   1. Depression with anxiety - Decreased ALPRAZolam (XANAX) 0.25 MG tablet; Take 1 tablet (0.25 mg total) by mouth 2 (two) times daily as needed for anxiety.  Dispense: 60 tablet; Refill: 0. Pt is aware that I will keep decreasing Xanax dose. He is aware that he will need to see a psychiatrist for possible long term Xanax prescription. I have made patient aware of the addictive potential of Xanax and the availability of alternative medications such as SSRI to truly help with his anxiety. Pt will need to be evaluated for bipolar disorder and the need for seroquel. I have stated patient will need the assistance of a psychiatrist and likely a psychologist.   2. Dysuria - Normal UA. Likely prostate related. Pt will see his urologist.   3. Screening for colon cancer - Fecal occult blood, imunochemical    Meds ordered this encounter  Medications  . ALPRAZolam (XANAX) 0.25 MG tablet    Sig: Take 1 tablet (0.25 mg total) by mouth 2 (two) times daily as needed for anxiety.    Dispense:  60 tablet    Refill:  0    Order Specific Question:   Supervising Provider    Answer:   Charlott Rakes [[id number]]    Follow-up: Return in about 6 weeks (around 08/03/2018) for anxiety.   [name] PA

## 2018-06-22 NOTE — Patient Instructions (Signed)

## 2018-06-23 LAB — HEPATIC FUNCTION PANEL
ALT: 21 IU/L (ref 0–44)
AST: 16 IU/L (ref 0–40)
Albumin: 4.2 g/dL (ref 3.6–4.8)
Alkaline Phosphatase: 161 IU/L — ABNORMAL HIGH (ref 39–117)
BILIRUBIN TOTAL: 0.3 mg/dL (ref 0.0–1.2)
BILIRUBIN, DIRECT: 0.08 mg/dL (ref 0.00–0.40)
TOTAL PROTEIN: 7.4 g/dL (ref 6.0–8.5)

## 2018-06-23 LAB — LIPID PANEL W/O CHOL/HDL RATIO
Cholesterol, Total: 109 mg/dL (ref 100–199)
HDL: 26 mg/dL — ABNORMAL LOW (ref >39)
LDL CALC: 59 mg/dL (ref 0–99)
TRIGLYCERIDES: 121 mg/dL (ref 0–149)
VLDL Cholesterol Cal: 24 mg/dL (ref 5–40)

## 2018-06-24 ENCOUNTER — Telehealth (INDEPENDENT_AMBULATORY_CARE_PROVIDER_SITE_OTHER): Payer: Self-pay

## 2018-06-24 ENCOUNTER — Ambulatory Visit (HOSPITAL_COMMUNITY): Payer: Medicare Other

## 2018-06-24 ENCOUNTER — Encounter: Payer: Self-pay | Admitting: *Deleted

## 2018-06-24 ENCOUNTER — Other Ambulatory Visit (INDEPENDENT_AMBULATORY_CARE_PROVIDER_SITE_OTHER): Payer: Self-pay | Admitting: Physician Assistant

## 2018-06-24 ENCOUNTER — Other Ambulatory Visit: Payer: Self-pay | Admitting: *Deleted

## 2018-06-24 DIAGNOSIS — R748 Abnormal levels of other serum enzymes: Secondary | ICD-10-CM

## 2018-06-24 DIAGNOSIS — R195 Other fecal abnormalities: Secondary | ICD-10-CM

## 2018-06-24 LAB — FECAL OCCULT BLOOD, IMMUNOCHEMICAL: FECAL OCCULT BLD: POSITIVE — AB

## 2018-06-24 NOTE — Telephone Encounter (Signed)
-----   Message from [name], PA-C sent at 06/24/2018  8:40 AM EST ----- Positive FIT. I have placed order for colonoscopy.

## 2018-06-24 NOTE — Telephone Encounter (Signed)
Patient is aware that FIT is positive and order has been placed for colonoscopy. Someone from gastro will contact him directly to schedule. Nat Christen, CMA

## 2018-06-26 ENCOUNTER — Ambulatory Visit (HOSPITAL_COMMUNITY): Payer: Medicare Other

## 2018-06-29 ENCOUNTER — Ambulatory Visit (HOSPITAL_COMMUNITY): Payer: Medicare Other

## 2018-07-01 ENCOUNTER — Telehealth (INDEPENDENT_AMBULATORY_CARE_PROVIDER_SITE_OTHER): Payer: Self-pay

## 2018-07-01 ENCOUNTER — Ambulatory Visit (HOSPITAL_COMMUNITY): Payer: Medicare Other

## 2018-07-01 NOTE — Telephone Encounter (Signed)
Katrina from [facility] [address] called and stated they don't have no record of pt bringing in Xanax prescription.  I contacted Lurena Joiner the clinical pharmacist and asked him to look in the database to see if the rx was taken somewhere else but there was no record of it  Spoke with Francee Piccolo regarding rx and made him aware about looking in the database  Per Francee Piccolo can give pharmacy a verbal order for rx  Gave verbal order for:  Rx Name: Xanax 0.25mg   Sig: Take 1 tablet by mouth 2 times daily prn for anxiety Quantity: 60 Refills: 0

## 2018-07-03 ENCOUNTER — Ambulatory Visit (HOSPITAL_COMMUNITY): Payer: Medicare Other

## 2018-07-06 ENCOUNTER — Ambulatory Visit (HOSPITAL_COMMUNITY): Payer: Medicare Other

## 2018-07-08 ENCOUNTER — Ambulatory Visit (HOSPITAL_COMMUNITY): Payer: Medicare Other

## 2018-07-10 ENCOUNTER — Ambulatory Visit (HOSPITAL_COMMUNITY): Payer: Medicare Other

## 2018-07-13 ENCOUNTER — Ambulatory Visit (HOSPITAL_COMMUNITY): Payer: Medicare Other

## 2018-07-15 ENCOUNTER — Ambulatory Visit (HOSPITAL_COMMUNITY): Payer: Self-pay | Admitting: Psychiatry

## 2018-07-15 ENCOUNTER — Ambulatory Visit (HOSPITAL_COMMUNITY): Payer: Medicare Other

## 2018-07-17 ENCOUNTER — Ambulatory Visit (HOSPITAL_COMMUNITY): Payer: Medicare Other

## 2018-07-20 ENCOUNTER — Ambulatory Visit (HOSPITAL_COMMUNITY): Payer: Medicare Other

## 2018-07-24 ENCOUNTER — Ambulatory Visit (HOSPITAL_COMMUNITY): Payer: Medicare Other

## 2018-07-27 ENCOUNTER — Ambulatory Visit (HOSPITAL_COMMUNITY): Payer: Medicare Other

## 2018-07-31 ENCOUNTER — Ambulatory Visit (HOSPITAL_COMMUNITY): Payer: Medicare Other

## 2018-08-03 ENCOUNTER — Ambulatory Visit (HOSPITAL_COMMUNITY): Payer: Medicare Other

## 2018-08-03 ENCOUNTER — Ambulatory Visit (INDEPENDENT_AMBULATORY_CARE_PROVIDER_SITE_OTHER): Payer: Medicare Other | Admitting: Physician Assistant

## 2018-08-05 ENCOUNTER — Ambulatory Visit (HOSPITAL_COMMUNITY): Payer: Medicare Other

## 2018-08-07 ENCOUNTER — Ambulatory Visit (HOSPITAL_COMMUNITY): Payer: Medicare Other

## 2018-08-10 ENCOUNTER — Ambulatory Visit (HOSPITAL_COMMUNITY): Payer: Medicare Other

## 2018-08-12 ENCOUNTER — Ambulatory Visit (HOSPITAL_COMMUNITY): Payer: Medicare Other

## 2018-08-12 ENCOUNTER — Ambulatory Visit (INDEPENDENT_AMBULATORY_CARE_PROVIDER_SITE_OTHER): Payer: Self-pay | Admitting: Nurse Practitioner

## 2018-09-28 ENCOUNTER — Encounter: Payer: Self-pay | Admitting: *Deleted

## 2018-09-30 ENCOUNTER — Telehealth: Payer: Self-pay | Admitting: *Deleted

## 2018-09-30 NOTE — Telephone Encounter (Signed)
   Primary Cardiologist: [name], MD  Chart reviewed as part of pre-operative protocol coverage. S/p Successful PCI of the mid RCA with DES x 1 02/10/2018. Recommend uninterrupted dual antiplatelet therapy with Aspirin 81mg  daily and Ticagrelor 90mg  twice daily for a minimum of 12 months.   Asking to hold Brillinta for 5 days given positive fit test. Please advised.   [place], [place] 09/30/2018, 2:55 PM

## 2018-09-30 NOTE — Telephone Encounter (Signed)
   [facility] Pre-operative Risk Assessment    Request for surgical clearance:  1. What type of surgery is being performed? COLONOSCOPY         POSITIVE FIT TEST 2. When is this surgery scheduled? TBD   3. What type of clearance is required (medical clearance vs. Pharmacy clearance to hold med vs. Both)? MEDICATION   4. Are there any medications that need to be held prior to surgery and how long?BRILINTA - PLEASE ADVISE WHEN OK TO STOP   5. Practice name and name of physician performing surgery? [facility]  [facility] [place]   6. What is your office phone number? [phone]   7.   What is your office fax number? [phone]   8.   Anesthesia type (None, local, MAC, general) ?

## 2018-10-01 NOTE — Telephone Encounter (Signed)
Ok to hold brilinta 5 days prior to colonoscopy [name]

## 2018-10-19 ENCOUNTER — Telehealth (INDEPENDENT_AMBULATORY_CARE_PROVIDER_SITE_OTHER): Payer: Self-pay | Admitting: Physician Assistant

## 2018-10-19 ENCOUNTER — Other Ambulatory Visit: Payer: Self-pay | Admitting: Primary Care

## 2018-10-19 DIAGNOSIS — F418 Other specified anxiety disorders: Secondary | ICD-10-CM

## 2018-10-19 MED ORDER — ALPRAZOLAM 0.25 MG PO TABS: 0.2500 mg | ORAL_TABLET | Freq: Every evening | ORAL | 1 refills | Status: DC | PRN

## 2018-10-19 NOTE — Telephone Encounter (Signed)
FWD to PCP. Yancy Knoble S Vaidehi Braddy, CMA  

## 2018-10-19 NOTE — Telephone Encounter (Signed)
Refilled xanax .25mg  once daily as needed. We are not supposed to refill this medication but refilled with decrease in amount.  Sent in 26

## 2018-10-19 NOTE — Telephone Encounter (Signed)
1) Medication(s) Requested (by name): ALPRAZolam (XANAX) 0.25 MG tablet   2) Pharmacy of Choice:   *patient stated he has not been able to get an appt de to everything going on. Patient states he went to the colon specialist but was unable to go get the colonoscopy done due to being on blood thinners. Patient states he is having a hard time anxiety wise so far and that he is having trouble sleeping.    Please follow up.

## 2018-10-19 NOTE — Telephone Encounter (Signed)
Patient is aware that Rx is at front desk ready for pick up. Nat Christen, CMA

## 2019-01-27 ENCOUNTER — Other Ambulatory Visit (INDEPENDENT_AMBULATORY_CARE_PROVIDER_SITE_OTHER): Payer: Self-pay

## 2019-01-27 DIAGNOSIS — Z760 Encounter for issue of repeat prescription: Secondary | ICD-10-CM

## 2019-01-27 MED ORDER — LISINOPRIL 20 MG PO TABS: 20.0000 mg | ORAL_TABLET | Freq: Every day | ORAL | 0 refills | Status: DC

## 2019-01-27 MED ORDER — ATORVASTATIN CALCIUM 80 MG PO TABS
80.0000 mg | ORAL_TABLET | Freq: Every evening | ORAL | 1 refills | Status: DC
Start: 2019-01-27 — End: 2019-02-10

## 2019-01-27 MED ORDER — METOPROLOL SUCCINATE ER 100 MG PO TB24: ORAL_TABLET | ORAL | 0 refills | Status: DC

## 2019-01-27 NOTE — Telephone Encounter (Signed)
Refill request from pharmacy. Sent lisinopril, metoprolol and atorvastatin. Request for quetiapine sent to PCP. Nat Christen, CMA

## 2019-01-28 ENCOUNTER — Other Ambulatory Visit (INDEPENDENT_AMBULATORY_CARE_PROVIDER_SITE_OTHER): Payer: Self-pay | Admitting: Primary Care

## 2019-01-28 DIAGNOSIS — Z760 Encounter for issue of repeat prescription: Secondary | ICD-10-CM

## 2019-01-28 MED ORDER — QUETIAPINE FUMARATE 200 MG PO TABS: 200.0000 mg | ORAL_TABLET | Freq: Every day | ORAL | 0 refills | Status: DC

## 2019-02-10 ENCOUNTER — Other Ambulatory Visit: Payer: Self-pay

## 2019-02-10 ENCOUNTER — Ambulatory Visit (INDEPENDENT_AMBULATORY_CARE_PROVIDER_SITE_OTHER): Payer: Medicare Other | Admitting: Primary Care

## 2019-02-10 ENCOUNTER — Encounter (INDEPENDENT_AMBULATORY_CARE_PROVIDER_SITE_OTHER): Payer: Self-pay | Admitting: Primary Care

## 2019-02-10 VITALS — BP 121/70 | HR 68 | Temp 98.2°F | Ht 72.0 in | Wt 234.2 lb

## 2019-02-10 DIAGNOSIS — I10 Essential (primary) hypertension: Secondary | ICD-10-CM | POA: Diagnosis not present

## 2019-02-10 DIAGNOSIS — R972 Elevated prostate specific antigen [PSA]: Secondary | ICD-10-CM

## 2019-02-10 DIAGNOSIS — I25709 Atherosclerosis of coronary artery bypass graft(s), unspecified, with unspecified angina pectoris: Secondary | ICD-10-CM

## 2019-02-10 DIAGNOSIS — C61 Malignant neoplasm of prostate: Secondary | ICD-10-CM | POA: Diagnosis not present

## 2019-02-10 DIAGNOSIS — F418 Other specified anxiety disorders: Secondary | ICD-10-CM | POA: Diagnosis not present

## 2019-02-10 DIAGNOSIS — E782 Mixed hyperlipidemia: Secondary | ICD-10-CM

## 2019-02-10 DIAGNOSIS — Z760 Encounter for issue of repeat prescription: Secondary | ICD-10-CM

## 2019-02-10 MED ORDER — ALPRAZOLAM 0.25 MG PO TABS: 0.2500 mg | ORAL_TABLET | Freq: Every evening | ORAL | 1 refills | Status: DC | PRN

## 2019-02-10 MED ORDER — ATORVASTATIN CALCIUM 80 MG PO TABS: 80.0000 mg | ORAL_TABLET | Freq: Every evening | ORAL | 1 refills | Status: DC

## 2019-02-10 MED ORDER — QUETIAPINE FUMARATE 200 MG PO TABS: 200.0000 mg | ORAL_TABLET | Freq: Every day | ORAL | 0 refills | Status: DC

## 2019-02-10 MED ORDER — TAMSULOSIN HCL 0.4 MG PO CAPS: 0.4000 mg | ORAL_CAPSULE | Freq: Every day | ORAL | 3 refills | Status: DC

## 2019-02-10 MED ORDER — LISINOPRIL 20 MG PO TABS: 20.0000 mg | ORAL_TABLET | Freq: Every day | ORAL | 0 refills | Status: DC

## 2019-02-10 NOTE — Progress Notes (Signed)
Established Patient Office Visit  Subjective:  Patient ID: Stephen Stone, male    DOB: 1956/03/13  Age: 63 y.o. MRN: [id number]  CC:  Chief Complaint  Patient presents with  . Establish Care  . Annual Exam    HPI Stephen Stone presents for medication refill for anxiety and requesting labs for health maintenance. He is followed by cardiology- Dr. Stanford Breed, Gastrology - Dr. Michail Sermon oncologist for prostate cancer -Dr. Rodman Key. He denies shortness of breath, headaches, chest pain or lower extremity edema  Past Medical History:  Diagnosis Date  . Anxiety   . Coronary artery disease cardioloigst-  dr Stanford Breed--- previously cardiologist from [facility]   hx anterior wall STEMI,  10-05-2011  cardiac cath w/ PCI and BM stent x1 to distal LAD  . Depression   . Essential hypertension   . 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 Hyperlipidemia   . 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 NSTEMI (non-ST elevated myocardial infarction) ([place]) 02/09/2018  . Prostate cancer [facility]) urologist-  dr wrenn/  onologist-  dr Tammi Klippel   dx Nov 2017 , Gleason 6, in [place]--  and repeat bx 08-19-2017 ,  Stage T1c, Gleason 3+4, PSA 25.20, vol 47cc-- scheduled for radioactive seed implants 12-25-2017  . STEMI (ST elevation myocardial infarction) ([place]) 10/05/2011    [place], [place]   acute anterior wall--- s/p  Cardiac cath w/ PCI and stent to distal LAD    Past Surgical History:  Procedure Laterality Date  . CARDIOVASCULAR STRESS TEST  04-23-2017   dr Stanford Breed   Low Risk Nuclear perfusion study w/ no evidence ischemia/  normal LV function and wall motion,  nuclear stress ef 58%  . CORONARY ANGIOPLASTY WITH STENT PLACEMENT  10-05-2011   [facility] in [place], [place] (records scanned in epic)   in setting anterior STEMI-- PCI and BM (Vision) stent x1 to distal LAD normal LVEF 60% with hypokinesis in the mid portion tf  the anterolateral wall (30% mLAD,  30% CFx , 95% dLAD)  . CORONARY STENT INTERVENTION N/A 02/10/2018   Procedure: CORONARY STENT INTERVENTION;  Surgeon: Martinique, Peter M, MD;  Location: [place] [facility];  Service: Cardiovascular;  Laterality: N/A;  . CYSTOSCOPY N/A 08/19/2017   Procedure: CYSTOSCOPY;  Surgeon: [name], MD;  Location: [facility];  Service: Urology;  Laterality: N/A;  . CYSTOSCOPY  12/25/2017   Procedure: CYSTOSCOPY;  Surgeon: [name], MD;  Location: [facility];  Service: Urology;;  no seeds noted in bladder  . EYE SURGERY Bilateral    "for the glaucoma; still went blind after 2-3 operations on each eye" (02/10/2018)  . LEFT HEART CATH AND CORONARY ANGIOGRAPHY N/A 02/10/2018   Procedure: LEFT HEART CATH AND CORONARY ANGIOGRAPHY;  Surgeon: Martinique, Peter M, MD;  Location: [place] [facility];  Service: Cardiovascular;  Laterality: N/A;  . PROSTATE BIOPSY N/A 08/19/2017   Procedure: BIOPSY TRANSRECTAL ULTRASONIC PROSTATE (TUBP);  Surgeon: [name], MD;  Location: [facility];  Service: Urology;  Laterality: N/A;  . RADIOACTIVE SEED IMPLANT N/A 12/25/2017   Procedure: RADIOACTIVE SEED IMPLANT/BRACHYTHERAPY IMPLANT;  Surgeon: [name], MD;  Location: [facility];  Service: Urology;  Laterality: N/A;  77 seeds implanted  . SPACE OAR INSTILLATION N/A 12/25/2017   Procedure: SPACE OAR INSTILLATION;  Surgeon: [name], MD;  Location: [facility];  Service: Urology;  Laterality: N/A;  . TRANSTHORACIC ECHOCARDIOGRAM  03-21-2016    Dr Ronal Fear [facility])   mild concentric LVH, ef 36-64%, grade 1 diastolic dysfunction/ mild PR/ trace TR    Family History  Problem Relation Age of Onset  . Cancer Mother        pancreatic  . CAD Father   . Cancer Brother        prostate  . Cancer Brother        prostate    Social History   Socioeconomic History  . Marital status: Divorced    Spouse name: Not on  file  . Number of children: 7  . Years of education: Not on file  . Highest education level: Not on file  Occupational History  . Not on file  Social Needs  . Financial resource strain: Not on file  . Food insecurity    Worry: Not on file    Inability: Not on file  . Transportation needs    Medical: Not on file    Non-medical: Not on file  Tobacco Use  . Smoking status: Current Some Day Smoker    Packs/day: 0.25    Years: 6.00    Pack years: 1.50    Types: Cigarettes    Last attempt to quit: 12/27/2017    Years since quitting: 1.1  . Smokeless tobacco: Never Used  Substance and Sexual Activity  . Alcohol use: Not Currently  . Drug use: Not Currently    Types: Marijuana    Comment: 02/10/2018 "in my 20's"  . Sexual activity: Not Currently    Comment: 2 years per patient  Lifestyle  . Physical activity    Days per week: Not on file    Minutes per session: Not on file  . Stress: Not on file  Relationships  . Social Herbalist on phone: Not on file    Gets together: Not on file    Attends religious service: Not on file    Active member of club or organization: Not on file    Attends meetings of clubs or organizations: Not on file    Relationship status: Not on file  . Intimate partner violence    Fear of current or ex partner: Not on file    Emotionally abused: Not on file    Physically abused: Not on file    Forced sexual activity: Not on file  Other Topics Concern  . Not on file  Social History Narrative  . Not on file    Outpatient Medications Prior to Visit  Medication Sig Dispense Refill  . aspirin EC 81 MG tablet Take 81 mg by mouth daily.    . metoprolol succinate (TOPROL-XL) 100 MG 24 hr tablet TAKE 1 TABLET BY MOUTH ONCE DAILY (TAKE WITH OR  IMMEDIATELY  FOLLOWING  A  MEAL) 90 tablet 0  . ticagrelor (BRILINTA) 90 MG TABS tablet Take 1 tablet (90 mg total) by mouth 2 (two) times daily. 180 tablet 3  . ALPRAZolam (XANAX) 0.25 MG tablet Take 1  tablet (0.25 mg total) by mouth at bedtime as needed for anxiety. 30 tablet 1  . atorvastatin (LIPITOR) 80 MG tablet Take 1 tablet (80 mg total) by mouth every evening. 90 tablet 1  . lisinopril (ZESTRIL) 20 MG tablet Take 1 tablet (20 mg total) by mouth daily. 90 tablet 0  . QUEtiapine (SEROQUEL) 200 MG tablet Take 1 tablet (200 mg total) by mouth at bedtime.  90 tablet 0  . tamsulosin (FLOMAX) 0.4 MG CAPS capsule Take 0.4 mg by mouth.    . nitroGLYCERIN (NITROSTAT) 0.4 MG SL tablet Place 1 tablet (0.4 mg total) under the tongue every 5 (five) minutes x 3 doses as needed for chest pain. (Patient not taking: Reported on 02/10/2019) 25 tablet 2  . amoxicillin-clavulanate (AUGMENTIN) 875-125 MG tablet Take 1 tablet by mouth 2 (two) times daily. 20 tablet 5   No facility-administered medications prior to visit.     No Known Allergies  ROS Review of Systems  HENT: Positive for dental problem.   Eyes:       Bilateral blidness  Musculoskeletal: Positive for gait problem.  Psychiatric/Behavioral: Positive for agitation. The patient is nervous/anxious.       Objective:    Physical Exam  Constitutional: He is oriented to person, place, and time. He appears well-developed and well-nourished.  HENT:  Head: Normocephalic and atraumatic.  Eyes:  Blind   Cardiovascular: Normal rate and regular rhythm.  Pulmonary/Chest: Effort normal and breath sounds normal.  Abdominal: Soft. Bowel sounds are normal. He exhibits distension.  Neurological: He is oriented to person, place, and time.  Skin: Skin is warm and dry.  Psychiatric: He has a normal mood and affect.    BP 121/70 (BP Location: Left Arm, Patient Position: Sitting, Cuff Size: Normal)   Pulse 68   Temp 98.2 F (36.8 C) (Tympanic)   Ht 6' (1.829 m)   Wt 234 lb 3.2 oz (106.2 kg)   SpO2 98%   BMI 31.76 kg/m  Wt Readings from Last 3 Encounters:  02/10/19 234 lb 3.2 oz (106.2 kg)  06/22/18 228 lb (103.4 kg)  05/28/18 235 lb (106.6 kg)      Health Maintenance Due  Topic Date Due  . Fecal DNA (Cologuard)  02/25/2004    There are no preventive care reminders to display for this patient.  No results found for: TSH Lab Results  Component Value Date   WBC 9.4 02/19/2018   HGB 13.8 02/19/2018   HCT 39.7 02/19/2018   MCV 91 02/19/2018   PLT 230 02/19/2018   Lab Results  Component Value Date   NA 140 02/11/2018   K 4.0 02/11/2018   CO2 24 02/11/2018   GLUCOSE 130 (H) 02/11/2018   BUN 12 02/11/2018   CREATININE 0.98 02/11/2018   BILITOT 0.3 06/22/2018   ALKPHOS 161 (H) 06/22/2018   AST 16 06/22/2018   ALT 21 06/22/2018   PROT 7.4 06/22/2018   ALBUMIN 4.2 06/22/2018   CALCIUM 8.9 02/11/2018   ANIONGAP 6 02/11/2018   Lab Results  Component Value Date   CHOL 109 06/22/2018   Lab Results  Component Value Date   HDL 26 (L) 06/22/2018   Lab Results  Component Value Date   LDLCALC 59 06/22/2018   Lab Results  Component Value Date   TRIG 121 06/22/2018   Lab Results  Component Value Date   CHOLHDL 4.1 09/17/2017   No results found for: HGBA1C    Assessment & Plan:   Problem List Items Addressed This Visit    Prostate cancer ([place])   Relevant Medications   ALPRAZolam (XANAX) 0.25 MG tablet   Other Relevant Orders   PSA   Hyperlipidemia   Relevant Medications   atorvastatin (LIPITOR) 80 MG tablet   lisinopril (ZESTRIL) 20 MG tablet   Other Relevant Orders   Lipid Panel   Essential hypertension   Relevant Medications   atorvastatin (LIPITOR) 80  MG tablet   lisinopril (ZESTRIL) 20 MG tablet   Other Relevant Orders   CMP14+EGFR   CBC with Differential   Coronary artery disease   Relevant Medications   atorvastatin (LIPITOR) 80 MG tablet   lisinopril (ZESTRIL) 20 MG tablet    Other Visit Diagnoses    Elevated PSA    -  Primary   Relevant Orders   PSA   Depression with anxiety       Relevant Medications   ALPRAZolam (XANAX) 0.25 MG tablet   Medication refill       Relevant  Medications   QUEtiapine (SEROQUEL) 200 MG tablet   atorvastatin (LIPITOR) 80 MG tablet   lisinopril (ZESTRIL) 20 MG tablet    Abdulkadir was seen today for establish care and annual exam.  Diagnoses and all orders for this visit:  Elevated PSA -     PSA  Depression with anxiety -     Discontinue: ALPRAZolam (XANAX) 0.25 MG tablet; Take 1 tablet (0.25 mg total) by mouth at bedtime as needed for anxiety. -     ALPRAZolam (XANAX) 0.25 MG tablet; Take 1 tablet (0.25 mg total) by mouth at bedtime as needed for anxiety.  Prostate cancer ([place]) -     PSA  Essential hypertension Counseled on blood pressure goal of less than 130/80, low-sodium, DASH diet, medication compliance, 150 minutes of moderate intensity exercise per week. Discussed medication compliance, adverse effects. -     CMP14+EGFR -     CBC with Differential  Mixed hyperlipidemia  Healthy lifestyle diet of fruits vegetables fish nuts whole grains and low saturated fat . Foods high in cholesterol or liver, fatty meats,cheese, butter avocados, nuts and seeds, chocolate and fried foods.  -     Lipid Panel  Medication refill -     QUEtiapine (SEROQUEL) 200 MG tablet; Take 1 tablet (200 mg total) by mouth at bedtime. -     atorvastatin (LIPITOR) 80 MG tablet; Take 1 tablet (80 mg total) by mouth every evening. -     lisinopril (ZESTRIL) 20 MG tablet; Take 1 tablet (20 mg total) by mouth daily.  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 Stanford Breed   Other orders -     tamsulosin (FLOMAX) 0.4 MG CAPS capsule; Take 1 capsule (0.4 mg total) by mouth daily.    Meds ordered this encounter  Medications  . QUEtiapine (SEROQUEL) 200 MG tablet    Sig: Take 1 tablet (200 mg total) by mouth at bedtime.    Dispense:  90 tablet    Refill:  0  . atorvastatin (LIPITOR) 80 MG tablet    Sig: Take 1 tablet (80 mg total) by mouth  every evening.    Dispense:  90 tablet    Refill:  1  . lisinopril (ZESTRIL) 20 MG tablet    Sig: Take 1 tablet (20 mg total) by mouth daily.    Dispense:  90 tablet    Refill:  0  . tamsulosin (FLOMAX) 0.4 MG CAPS capsule    Sig: Take 1 capsule (0.4 mg total) by mouth daily.    Dispense:  30 capsule    Refill:  3  . DISCONTD: ALPRAZolam (XANAX) 0.25 MG tablet    Sig: Take 1 tablet (0.25 mg total) by mouth at bedtime as needed for anxiety.    Dispense:  30 tablet    Refill:  1  . ALPRAZolam Duanne Moron)  0.25 MG tablet    Sig: Take 1 tablet (0.25 mg total) by mouth at bedtime as needed for anxiety.    Dispense:  30 tablet    Refill:  1    Follow-up: Return if symptoms worsen or fail to improve.    Kerin Perna, NP

## 2019-02-10 NOTE — Patient Instructions (Signed)

## 2019-02-11 LAB — CBC WITH DIFFERENTIAL/PLATELET
Basophils Absolute: 0 10*3/uL (ref 0.0–0.2)
Basos: 0 %
EOS (ABSOLUTE): 0.3 10*3/uL (ref 0.0–0.4)
Eos: 2 %
Hematocrit: 42.3 % (ref 37.5–51.0)
Hemoglobin: 13.6 g/dL (ref 13.0–17.7)
Immature Grans (Abs): 0 10*3/uL (ref 0.0–0.1)
Immature Granulocytes: 0 %
Lymphocytes Absolute: 2.1 10*3/uL (ref 0.7–3.1)
Lymphs: 19 %
MCH: 29.2 pg (ref 26.6–33.0)
MCHC: 32.2 g/dL (ref 31.5–35.7)
MCV: 91 fL (ref 79–97)
Monocytes Absolute: 0.6 10*3/uL (ref 0.1–0.9)
Monocytes: 6 %
Neutrophils Absolute: 8 10*3/uL — ABNORMAL HIGH (ref 1.4–7.0)
Neutrophils: 73 %
Platelets: 268 10*3/uL (ref 150–450)
RBC: 4.66 x10E6/uL (ref 4.14–5.80)
RDW: 13.5 % (ref 11.6–15.4)
WBC: 11.1 10*3/uL — ABNORMAL HIGH (ref 3.4–10.8)

## 2019-02-11 LAB — CMP14+EGFR
ALT: 18 IU/L (ref 0–44)
AST: 12 IU/L (ref 0–40)
Albumin/Globulin Ratio: 1.2 (ref 1.2–2.2)
Albumin: 3.9 g/dL (ref 3.8–4.8)
Alkaline Phosphatase: 150 IU/L — ABNORMAL HIGH (ref 39–117)
BUN/Creatinine Ratio: 11 (ref 10–24)
BUN: 12 mg/dL (ref 8–27)
Bilirubin Total: 0.2 mg/dL (ref 0.0–1.2)
CO2: 19 mmol/L — ABNORMAL LOW (ref 20–29)
Calcium: 9.3 mg/dL (ref 8.6–10.2)
Chloride: 107 mmol/L — ABNORMAL HIGH (ref 96–106)
Creatinine, Ser: 1.09 mg/dL (ref 0.76–1.27)
GFR calc Af Amer: 82 mL/min/{1.73_m2} (ref >59)
GFR calc non Af Amer: 71 mL/min/{1.73_m2} (ref >59)
Globulin, Total: 3.3 g/dL (ref 1.5–4.5)
Glucose: 130 mg/dL — ABNORMAL HIGH (ref 65–99)
Potassium: 4.3 mmol/L (ref 3.5–5.2)
Sodium: 138 mmol/L (ref 134–144)
Total Protein: 7.2 g/dL (ref 6.0–8.5)

## 2019-02-11 LAB — LIPID PANEL
Chol/HDL Ratio: 4.5 ratio (ref 0.0–5.0)
Cholesterol, Total: 136 mg/dL (ref 100–199)
HDL: 30 mg/dL — ABNORMAL LOW (ref >39)
LDL Calculated: 64 mg/dL (ref 0–99)
Triglycerides: 209 mg/dL — ABNORMAL HIGH (ref 0–149)
VLDL Cholesterol Cal: 42 mg/dL — ABNORMAL HIGH (ref 5–40)

## 2019-02-11 LAB — PSA: Prostate Specific Ag, Serum: 0.6 ng/mL (ref 0.0–4.0)

## 2019-02-16 ENCOUNTER — Telehealth (INDEPENDENT_AMBULATORY_CARE_PROVIDER_SITE_OTHER): Payer: Self-pay

## 2019-02-16 NOTE — Telephone Encounter (Signed)
Patients daughter is aware of results. She confirmed patients urologist, gastrologist and cardiologist. Urologist and cardiologist are on epic both offices stated no need to fax results. Results faxed to [facility] GI attention to Dr. Michail Sermon. Patients daughter will inform patient of results. Nat Christen, CMA

## 2019-02-16 NOTE — Telephone Encounter (Signed)
-----   Message from Kerin Perna, NP sent at 02/11/2019  1:40 PM EDT ----- Please forward labs to urologist, gastrologist and cardiologist

## 2019-05-05 ENCOUNTER — Telehealth (INDEPENDENT_AMBULATORY_CARE_PROVIDER_SITE_OTHER): Payer: Self-pay

## 2019-05-05 NOTE — Telephone Encounter (Signed)
Patient called to make a medication refill for  ticagrelor (BRILINTA) 90 MG TABS tablet   QUEtiapine (SEROQUEL) 200 MG tablet  Patient uses  [place] ([place] [place] [facility] [place]  Please advice [phone]  Thank you [name]

## 2019-05-06 ENCOUNTER — Other Ambulatory Visit (INDEPENDENT_AMBULATORY_CARE_PROVIDER_SITE_OTHER): Payer: Self-pay | Admitting: Primary Care

## 2019-05-06 DIAGNOSIS — Z760 Encounter for issue of repeat prescription: Secondary | ICD-10-CM

## 2019-05-06 MED ORDER — QUETIAPINE FUMARATE 200 MG PO TABS: 200.0000 mg | ORAL_TABLET | Freq: Every day | ORAL | 0 refills | Status: DC

## 2019-05-06 NOTE — Telephone Encounter (Signed)
ticagrelor (BRILINTA) 90 MG TABS tablet has been prescribed by cardiologist have patient call them for an appointment or refills- use of this medication causes increase risk for bleeding, nose bleeds and bruising

## 2019-05-06 NOTE — Telephone Encounter (Signed)
Patient is aware that seroquel has been sent to pharmacy. But he needs to contact cardiologist to have Brilinta refilled. Nat Christen, CMA

## 2019-05-06 NOTE — Telephone Encounter (Signed)
FWD to PCP. Crandall Harvel S Jerritt Cardoza, CMA  

## 2019-05-13 ENCOUNTER — Other Ambulatory Visit (INDEPENDENT_AMBULATORY_CARE_PROVIDER_SITE_OTHER): Payer: Self-pay | Admitting: Primary Care

## 2019-05-13 DIAGNOSIS — Z760 Encounter for issue of repeat prescription: Secondary | ICD-10-CM

## 2019-05-24 IMAGING — CT CT ANGIO CHEST
3 of 7 series · 17 of 36 positions shown · IV contrast (Omni 300)
Comparison: January 29, 2018

CLINICAL DATA: Chest pain

EXAM:
CT ANGIOGRAPHY CHEST WITH CONTRAST
TECHNIQUE: Multidetector CT imaging of the chest was performed using the
standard protocol during bolus administration of intravenous
contrast. Multiplanar CT image reconstructions and MIPs were
obtained to evaluate the vascular anatomy.
CONTRAST:  100mL CATHE7-7XF IOPAMIDOL (CATHE7-7XF) INJECTION 76%

[Series 6: pe thins · axial · 0.98mm/px · z∈[+1416,+1435]mm · 2 of 59 slices shown (1 of 2)]
[im 20/59  lung]
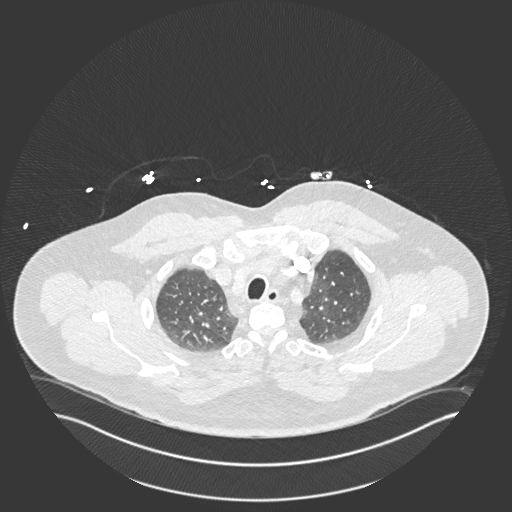
[im 39/59  lung]
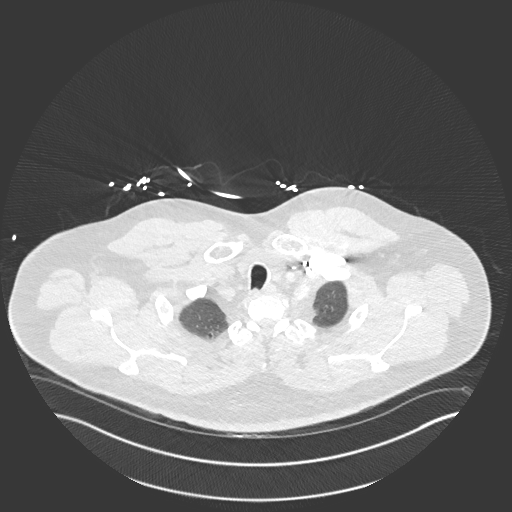

[Series 8: pe 2mm cor · coronal · 0.51mm/px · 1 of 143 slices shown]
[im 72/143  mediastinal]
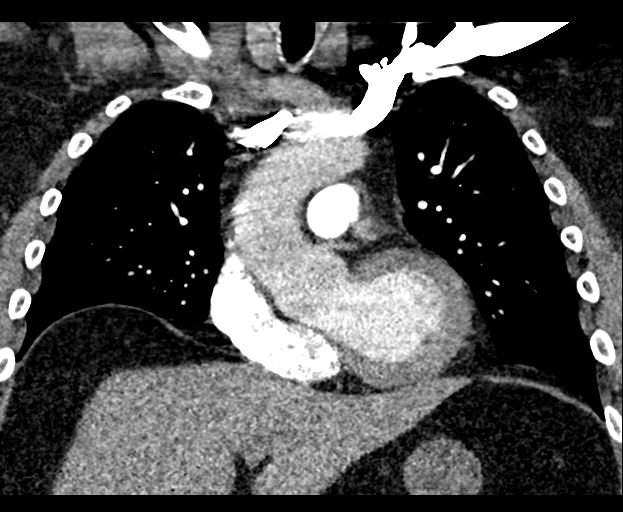

[Series 13: pe thins · axial · 0.74mm/px · z∈[+1233,+1439]mm · 14 of 238 slices shown (2 of 2)]
[im 16/238  lung]
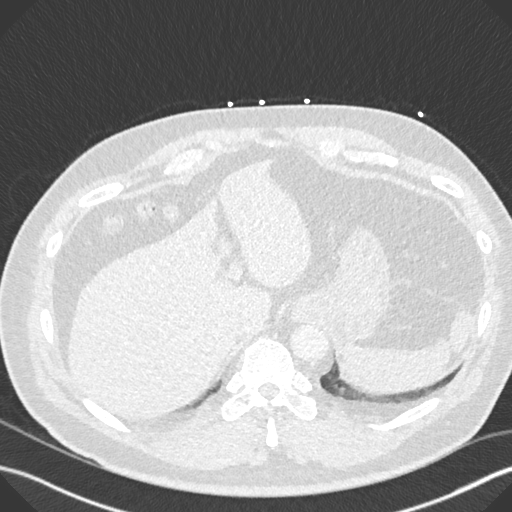
[im 32/238  mediastinal]
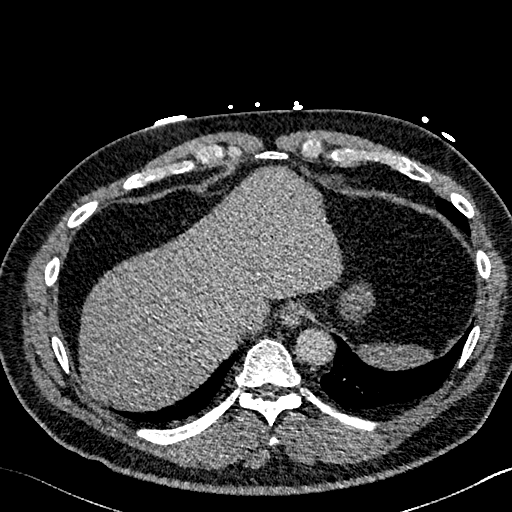
[im 48/238  lung]
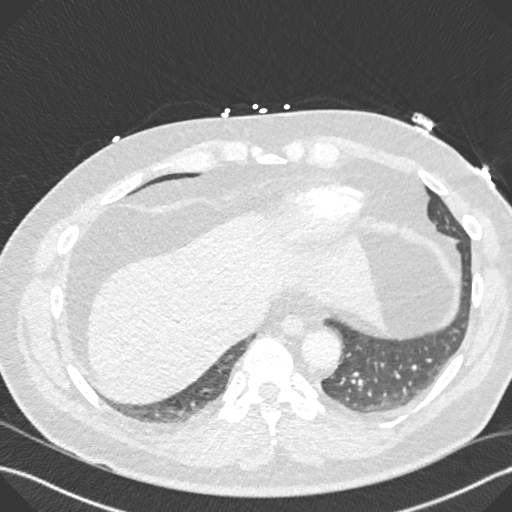
[im 64/238  mediastinal]
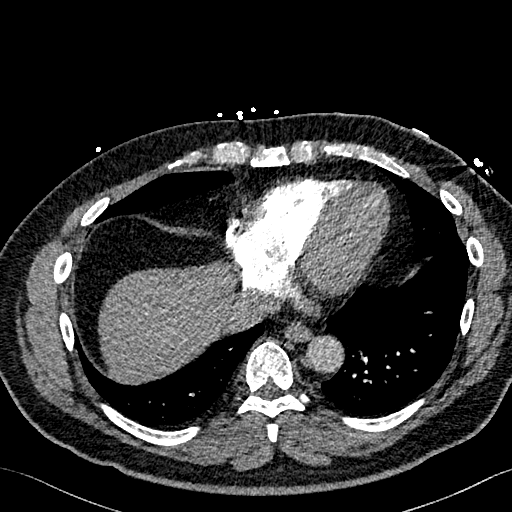
[im 80/238  lung]
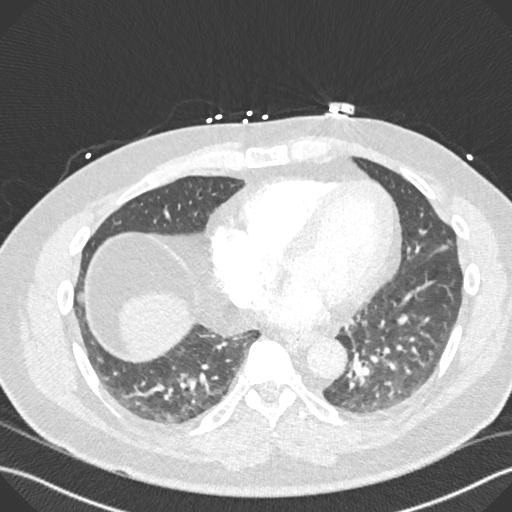
[im 95/238  mediastinal]
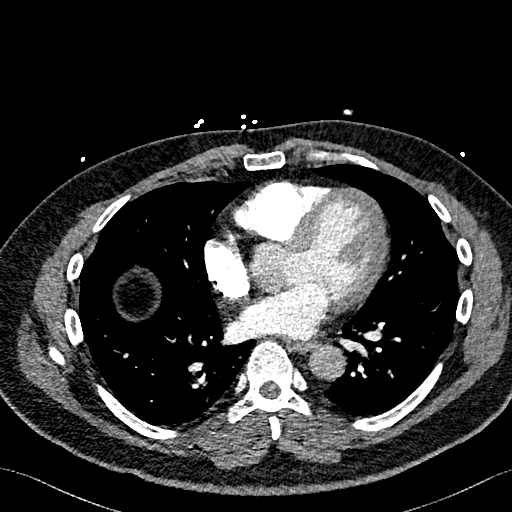
[im 111/238  lung]
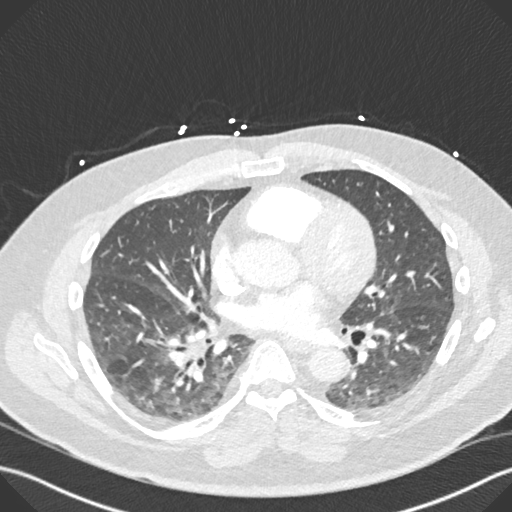
[im 127/238  mediastinal]
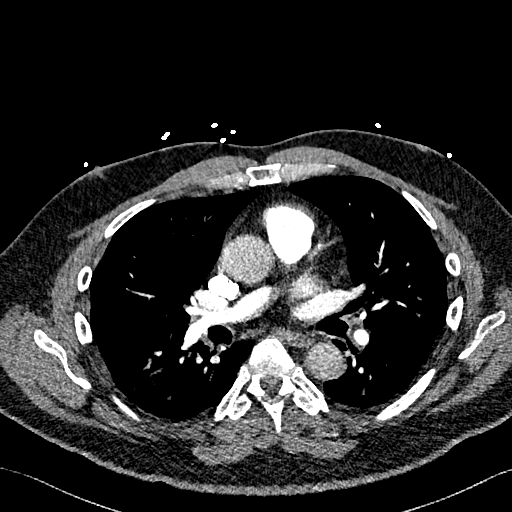
[im 143/238  lung]
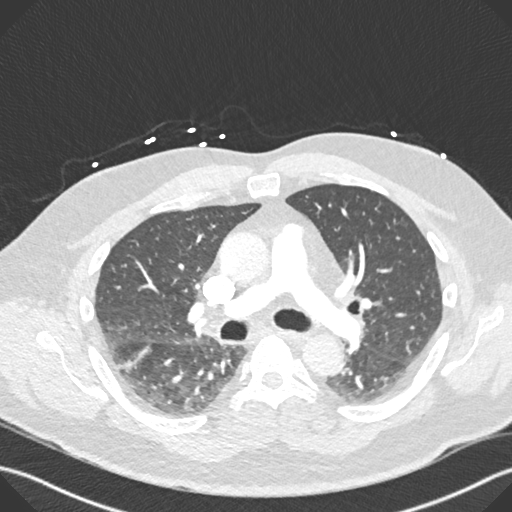
[im 159/238  mediastinal]
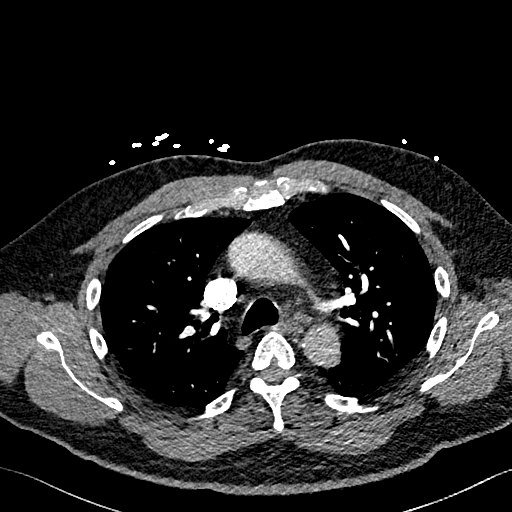
[im 174/238  lung]
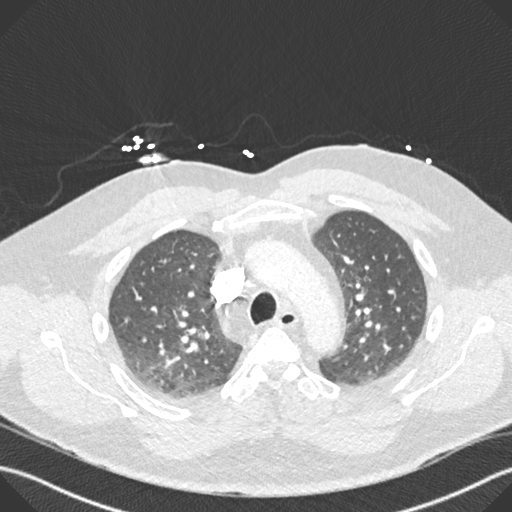
[im 190/238  mediastinal]
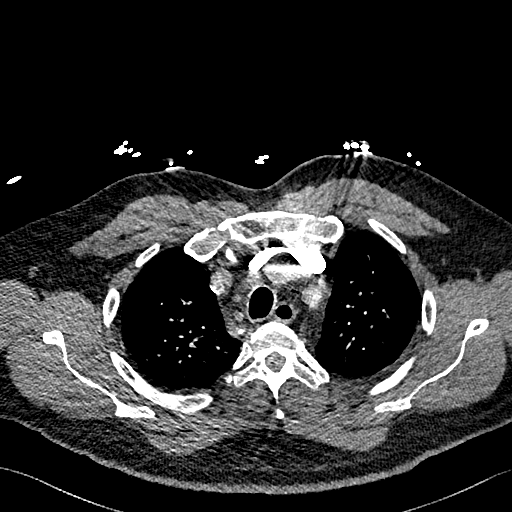
[im 206/238  lung]
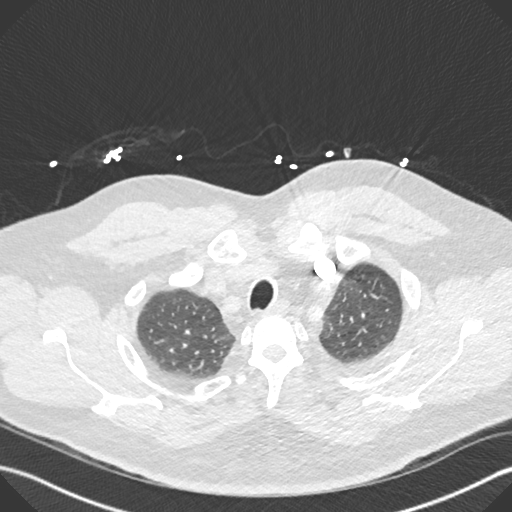
[im 222/238  mediastinal]
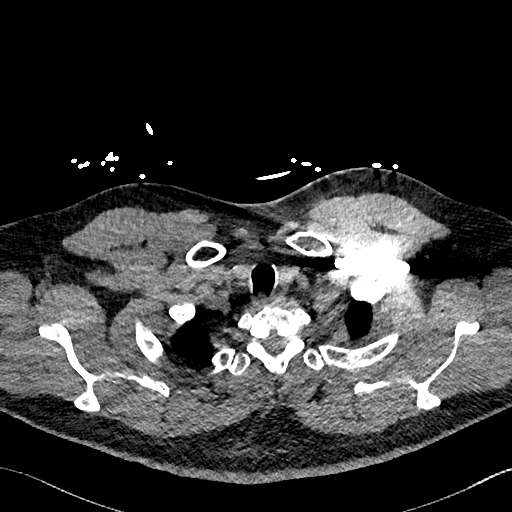

[17 of 36 positions shown; findings below may reference images not displayed]

FINDINGS: Cardiovascular: There is no demonstrable pulmonary embolus. There is
no thoracic aortic aneurysm. No dissection evident. It should be
noted that the contrast bolus in the aorta is less than optimal for
potential dissection assessment. Visualized great vessels appear
unremarkable. Note that the left common and right innominate
arteries arise as a common trunk, an anatomic variant. There is no
pericardial effusion or pericardial thickening. There are foci of
coronary artery calcification evident.

Mediastinum/Nodes: Visualized thyroid appears normal. There are
subcentimeter mediastinal lymph nodes. There is no adenopathy by
size criteria. There are occasional foci lymph node calcification
consistent with prior granulomatous disease. No esophageal lesions
are evident.

Lungs/Pleura: There is no frank edema or consolidation. There is
mild mosaic attenuation in the lungs suggesting atelectasis with
probable small airways obstructive disease. No appreciable pleural
effusion or pleural thickening evident.

Upper Abdomen: There is a small calcified granuloma in the spleen.
Visualized upper abdominal structures otherwise appear unremarkable.

Musculoskeletal: There are no blastic or lytic bone lesions. No
chest wall lesions are evident.

Review of the MIP images confirms the above findings.
IMPRESSION: 1. No demonstrable pulmonary embolus. No thoracic aortic aneurysm or
dissection. Note that the contrast bolus in the aorta is less than
optimal for assessment for potential dissection. Foci of coronary
artery calcification are noted.

2. No frank edema or consolidation. Areas of mosaic attenuation
likely represent atelectasis and a degree of small airways
obstructive disease.

3.  No thoracic adenopathy.

4.  Evidence of prior granulomatous disease.

## 2019-06-03 IMAGING — CR DG CHEST 2V
2 series · 2 of 2 positions shown · non-contrast
Comparison: Chest CT dated 01/31/2018

CLINICAL DATA: 61-year-old male with left-sided chest pain.

EXAM:
CHEST - 2 VIEW

[chest lat]
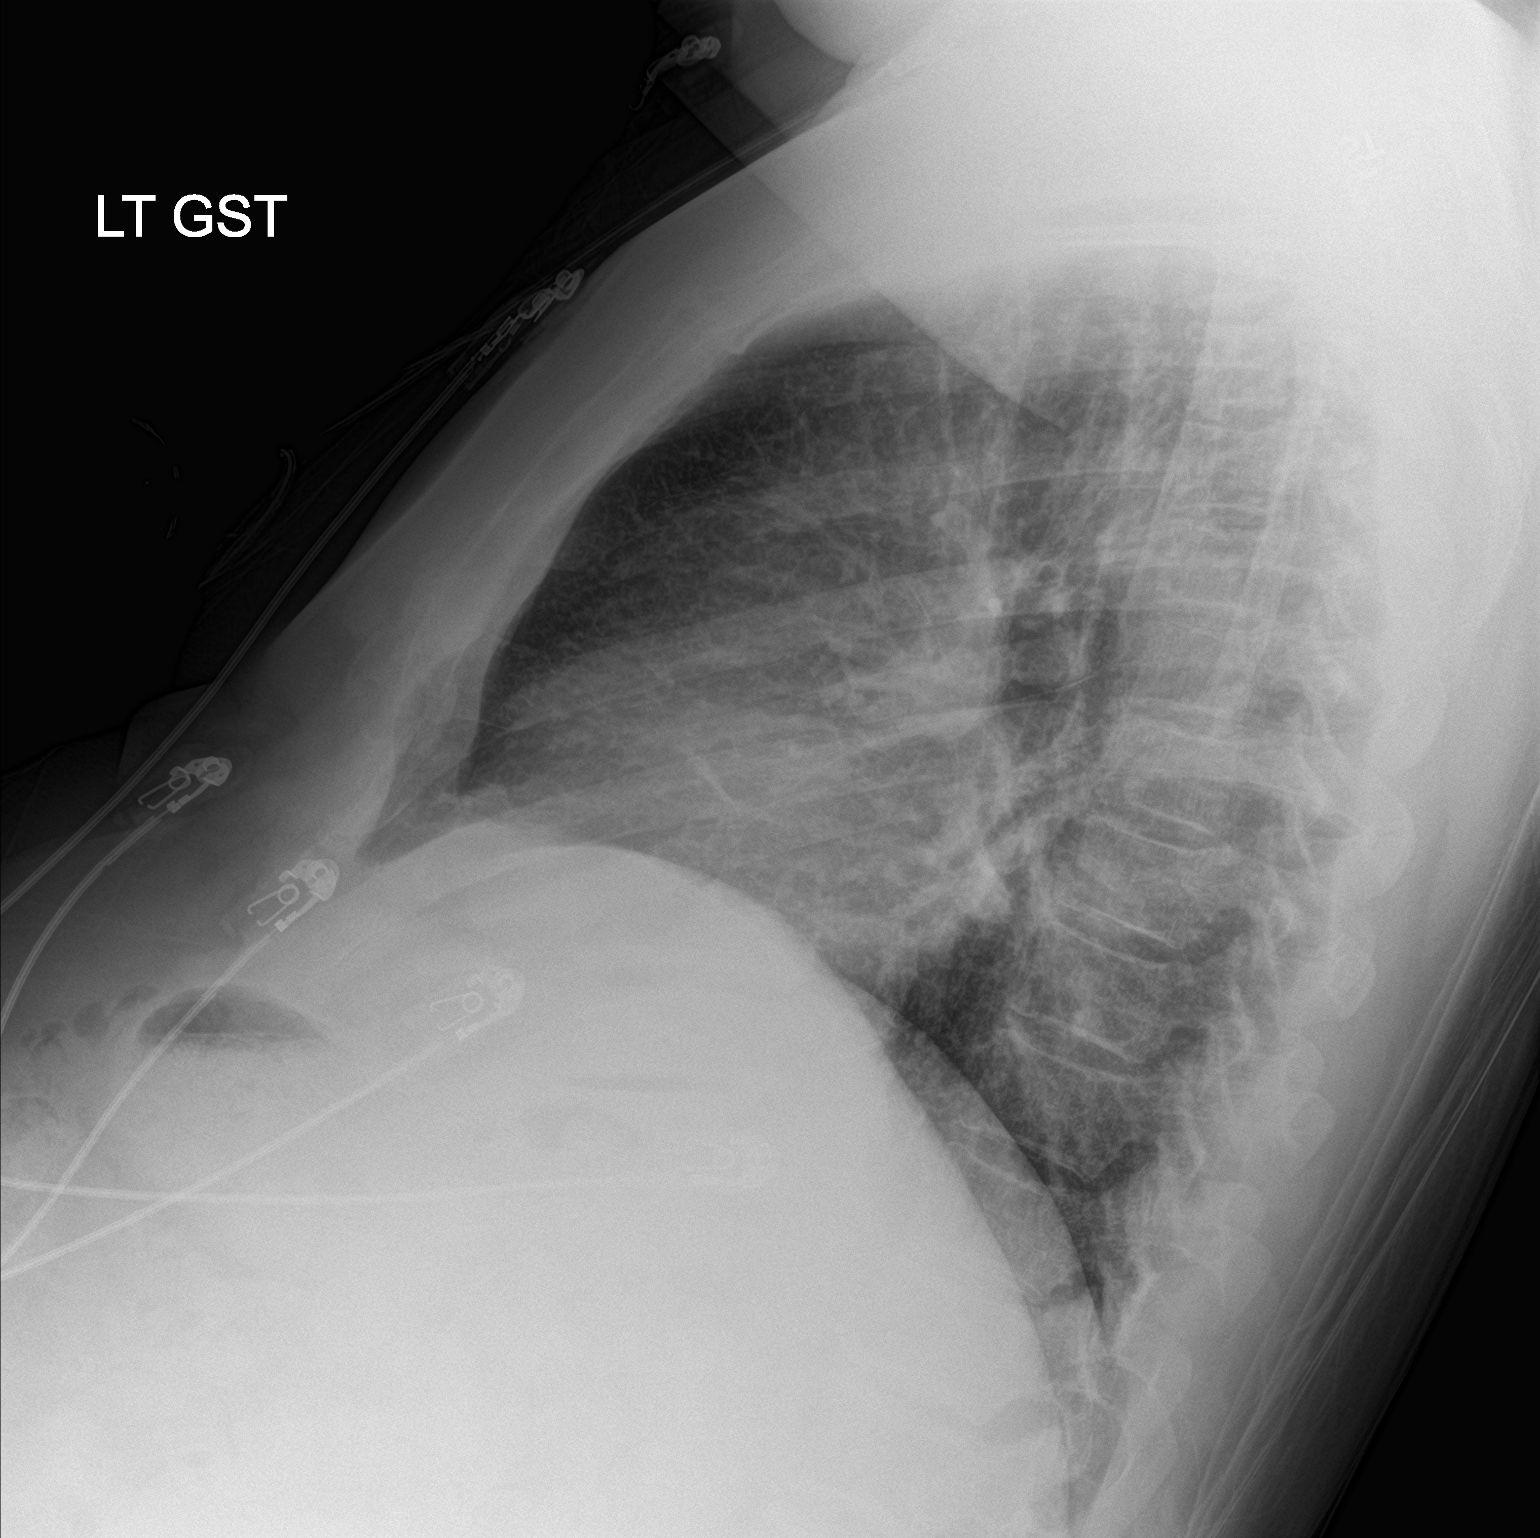

[chest ap]
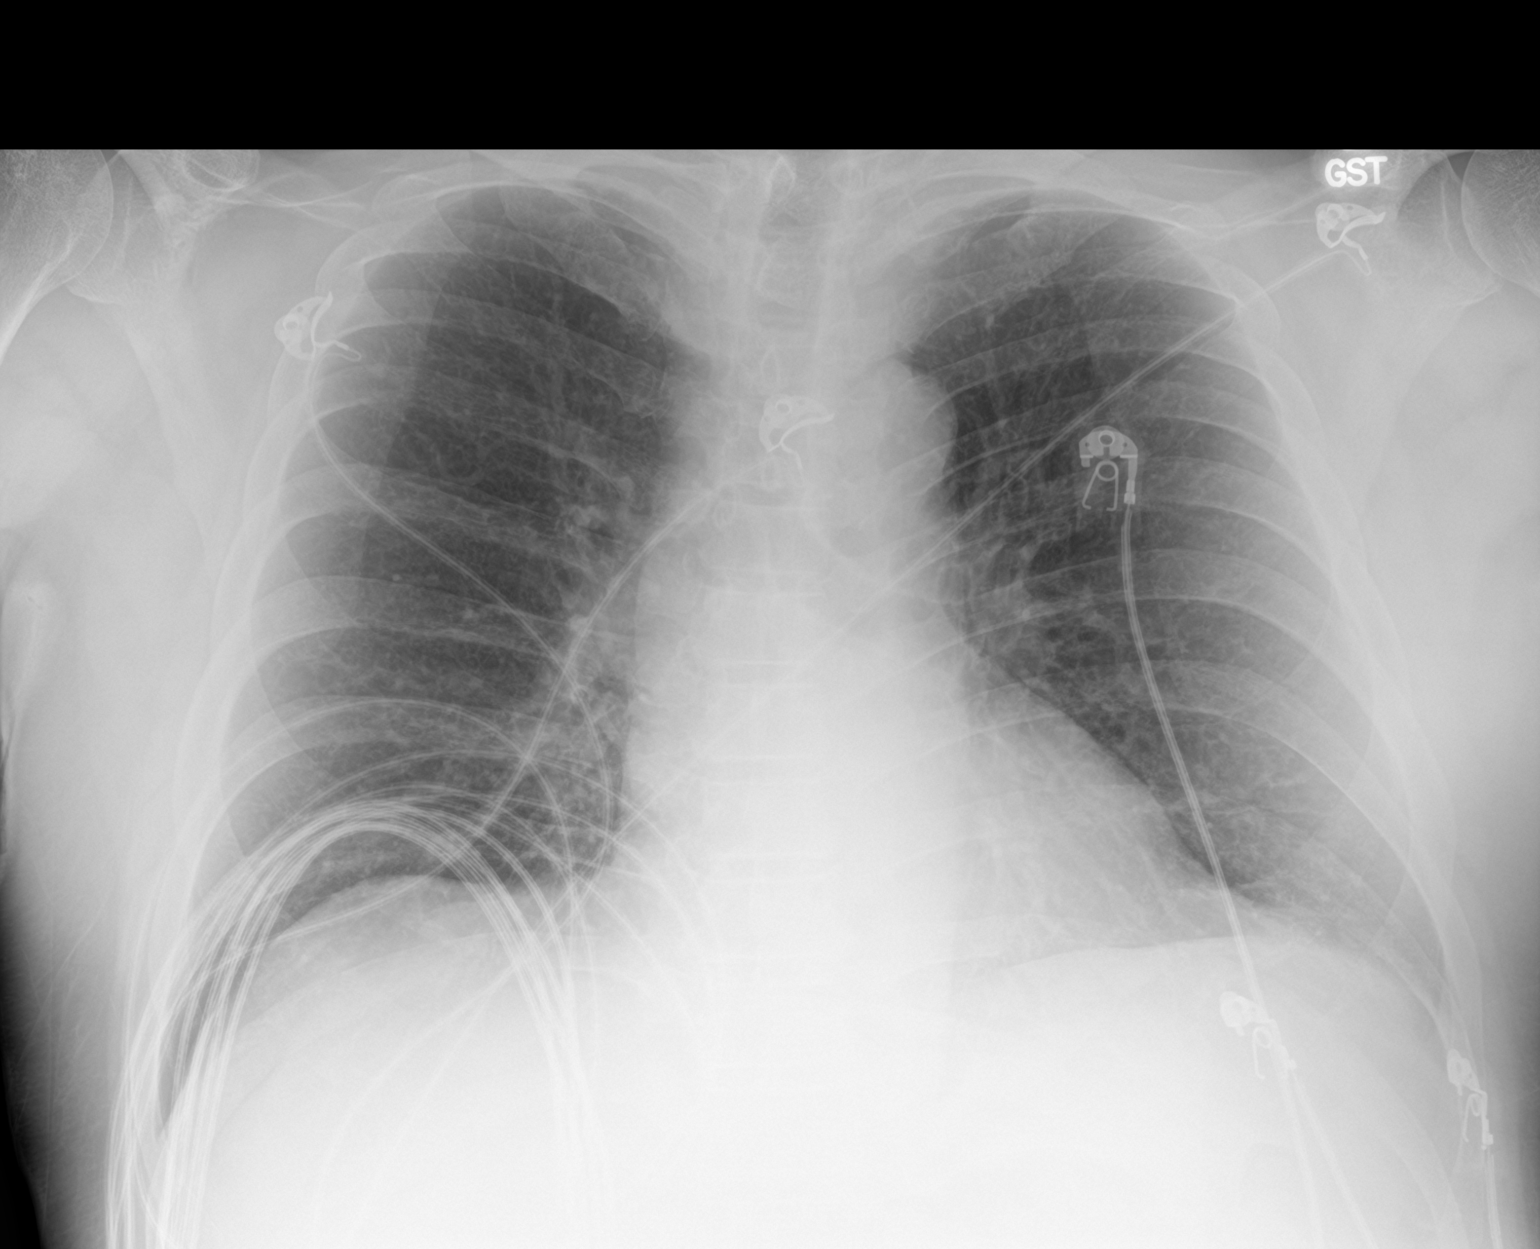

[2 of 2 positions shown; findings below may reference images not displayed]

FINDINGS: The heart size and mediastinal contours are within normal limits.
Both lungs are clear. The visualized skeletal structures are
unremarkable.
IMPRESSION: No active cardiopulmonary disease.

## 2019-08-26 ENCOUNTER — Other Ambulatory Visit (INDEPENDENT_AMBULATORY_CARE_PROVIDER_SITE_OTHER): Payer: Self-pay | Admitting: Primary Care

## 2019-08-26 DIAGNOSIS — Z760 Encounter for issue of repeat prescription: Secondary | ICD-10-CM

## 2019-08-26 NOTE — Telephone Encounter (Signed)
Sent to PCP ?

## 2019-09-02 NOTE — Telephone Encounter (Signed)
Needs appointment

## 2019-09-27 ENCOUNTER — Other Ambulatory Visit (INDEPENDENT_AMBULATORY_CARE_PROVIDER_SITE_OTHER): Payer: Self-pay | Admitting: Primary Care

## 2019-09-27 DIAGNOSIS — Z760 Encounter for issue of repeat prescription: Secondary | ICD-10-CM

## 2019-09-27 MED ORDER — LISINOPRIL 20 MG PO TABS: 20.0000 mg | ORAL_TABLET | Freq: Every day | ORAL | 0 refills | Status: DC

## 2019-09-27 MED ORDER — METOPROLOL SUCCINATE ER 100 MG PO TB24: ORAL_TABLET | ORAL | 0 refills | Status: DC

## 2019-09-30 NOTE — Progress Notes (Signed)
Virtual Visit via Telephone Note changed to phone visit at patient request   This visit type was conducted due to national recommendations for restrictions regarding the COVID-19 Pandemic (e.g. social distancing) in an effort to limit this patient's exposure and mitigate transmission in our community.  Due to his co-morbid illnesses, this patient is at least at moderate risk for complications without adequate follow up.  This format is felt to be most appropriate for this patient at this time.  The patient did not have access to video technology/had technical difficulties with video requiring transitioning to audio format only (telephone).  All issues noted in this document were discussed and addressed.  No physical exam could be performed with this format.  Please refer to the patient's chart for his  consent to telehealth for [facility].   Date:  10/08/2019   ID:  Stephen Stone, DOB 1956-05-22, MRN [id number]  Patient Location:Home Provider Location: Home  PCP:  Kerin Perna, NP  Cardiologist:  Dr Stanford Breed  Evaluation Performed:  Follow-Up Visit  Chief Complaint:  FU CAD  History of Present Illness:    FU CAD.Patient has had previous PCI in [place] (LAD). Cardiac catheterization July 2019 showed a 30% proximal LAD, 40% ostial first marginal, 99% proximal to mid RCA. Patient had PCI of his right coronary artery with drug-eluting stent. Echocardiogram July 2019 showed vigorous LV function, moderate left ventricular hypertrophy, mild diastolic dysfunction and mildly dilated aortic root. Since last seen pt had Covid approximately 3 weeks ago.  Patient today states that he has been having chest pain since 530 last evening.  It is in the left breast area without radiation and there are no associated symptoms.  He states it is identical to the pain at the time of his previous myocardial infarction.  He denies dyspnea, melena or hematochezia.  The patient does not have  symptoms concerning for COVID-19 infection (fever, chills, cough, or new shortness of breath).    Past Medical History:  Diagnosis Date  . Anxiety   . Coronary artery disease cardioloigst-  dr Stanford Breed--- previously cardiologist from [facility]   hx anterior wall STEMI,  10-05-2011  cardiac cath w/ PCI and BM stent x1 to distal LAD  . Depression   . Essential hypertension   . 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 Hyperlipidemia   . 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 NSTEMI (non-ST elevated myocardial infarction) ([place]) 02/09/2018  . Prostate cancer [facility]) urologist-  dr wrenn/  onologist-  dr Tammi Klippel   dx Nov 2017 , Gleason 6, in [place]--  and repeat bx 08-19-2017 ,  Stage T1c, Gleason 3+4, PSA 25.20, vol 47cc-- scheduled for radioactive seed implants 12-25-2017  . STEMI (ST elevation myocardial infarction) ([place]) 10/05/2011    [place], [place]   acute anterior wall--- s/p  Cardiac cath w/ PCI and stent to distal LAD   Past Surgical History:  Procedure Laterality Date  . CARDIOVASCULAR STRESS TEST  04-23-2017   dr Stanford Breed   Low Risk Nuclear perfusion study w/ no evidence ischemia/  normal LV function and wall motion,  nuclear stress ef 58%  . CORONARY ANGIOPLASTY WITH STENT PLACEMENT  10-05-2011   [facility] in [place], [place] (records scanned in epic)   in setting anterior STEMI-- PCI and BM (Vision) stent x1 to distal LAD normal LVEF 60% with hypokinesis in the mid  portion tf the anterolateral wall (30% mLAD,  30% CFx , 95% dLAD)  . CORONARY STENT INTERVENTION N/A 02/10/2018   Procedure: CORONARY STENT INTERVENTION;  Surgeon: Martinique, Peter M, MD;  Location: [facility];  Service: Cardiovascular;  Laterality: N/A;  . CYSTOSCOPY N/A 08/19/2017   Procedure: CYSTOSCOPY;  Surgeon: [name], MD;  Location: [facility];  Service: Urology;  Laterality: N/A;  . CYSTOSCOPY  12/25/2017    Procedure: CYSTOSCOPY;  Surgeon: [name], MD;  Location: [facility];  Service: Urology;;  no seeds noted in bladder  . EYE SURGERY Bilateral    "for the glaucoma; still went blind after 2-3 operations on each eye" (02/10/2018)  . LEFT HEART CATH AND CORONARY ANGIOGRAPHY N/A 02/10/2018   Procedure: LEFT HEART CATH AND CORONARY ANGIOGRAPHY;  Surgeon: Martinique, Peter M, MD;  Location: [facility];  Service: Cardiovascular;  Laterality: N/A;  . PROSTATE BIOPSY N/A 08/19/2017   Procedure: BIOPSY TRANSRECTAL ULTRASONIC PROSTATE (TUBP);  Surgeon: [name], MD;  Location: [facility];  Service: Urology;  Laterality: N/A;  . RADIOACTIVE SEED IMPLANT N/A 12/25/2017   Procedure: RADIOACTIVE SEED IMPLANT/BRACHYTHERAPY IMPLANT;  Surgeon: [name], MD;  Location[facility];  Service: Urology;  Laterality: N/A;  77 seeds implanted  . SPACE OAR INSTILLATION N/A 12/25/2017   Procedure: SPACE OAR INSTILLATION;  Surgeon: [name], MD;  Location: [facility];  Service: Urology;  Laterality: N/A;  . TRANSTHORACIC ECHOCARDIOGRAM  03-21-2016    Dr Ronal Fear [facility])   mild concentric LVH, ef Q000111Q, grade 1 diastolic dysfunction/ mild PR/ trace TR     Current Meds  Medication Sig  . aspirin EC 81 MG tablet Take 81 mg by mouth daily.  Marland Kitchen atorvastatin (LIPITOR) 80 MG tablet Take 1 tablet (80 mg total) by mouth every evening.  Marland Kitchen lisinopril (ZESTRIL) 20 MG tablet Take 1 tablet (20 mg total) by mouth daily.  . metoprolol succinate (TOPROL-XL) 100 MG 24 hr tablet TAKE 1 TABLET BY MOUTH ONCE DAILY WITH MEALS  . nitroGLYCERIN (NITROSTAT) 0.4 MG SL tablet Place 1 tablet (0.4 mg total) under the tongue every 5 (five) minutes x 3 doses as needed for chest pain.  Marland Kitchen QUEtiapine (SEROQUEL) 200 MG tablet Take 1 tablet (200 mg total) by mouth at bedtime.  . tamsulosin (FLOMAX) 0.4 MG CAPS capsule Take 1 capsule (0.4 mg total) by mouth daily.  .  [DISCONTINUED] ALPRAZolam (XANAX) 0.25 MG tablet Take 1 tablet (0.25 mg total) by mouth at bedtime as needed for anxiety.     Allergies:   Patient has no known allergies.   Social History   Tobacco Use  . Smoking status: Current Some Day Smoker    Packs/day: 0.25    Years: 6.00    Pack years: 1.50    Types: Cigarettes    Last attempt to quit: 12/27/2017    Years since quitting: 1.7  . Smokeless tobacco: Never Used  Substance Use Topics  . Alcohol use: Not Currently  . Drug use: Not Currently    Types: Marijuana    Comment: 02/10/2018 "in my 20's"     Family Hx: The patient's family history includes CAD in his father; Cancer in his brother, brother, and mother.  ROS:   Please see the history of present illness.    No Fever, chills  or productive cough All other systems reviewed and are negative.  Recent Labs: 02/10/2019: ALT 18; BUN 12;  Creatinine, Ser 1.09; Hemoglobin 13.6; Platelets 268; Potassium 4.3; Sodium 138   Recent Lipid Panel Lab Results  Component Value Date/Time   CHOL 136 02/10/2019 03:22 PM   TRIG 209 (H) 02/10/2019 03:22 PM   HDL 30 (L) 02/10/2019 03:22 PM   CHOLHDL 4.5 02/10/2019 03:22 PM   LDLCALC 64 02/10/2019 03:22 PM    Wt Readings from Last 3 Encounters:  10/08/19 250 lb (113.4 kg)  02/10/19 234 lb 3.2 oz (106.2 kg)  06/22/18 228 lb (103.4 kg)     Objective:    Vital Signs:  Ht 6' (1.829 m)   Wt 250 lb (113.4 kg)   BMI 33.91 kg/m    VITAL SIGNS:  reviewed NAD Answers questions appropriately Normal affect Remainder of physical examination not performed (telehealth visit; coronavirus pandemic)  ASSESSMENT & PLAN:    1. Coronary artery disease-pt is in [place] today.  During our discussions he described ongoing chest pain since 530 last evening.  He states it is identical to the pain at time of previous myocardial infarction.  I asked him to call EMS immediately and be taken to the nearest emergency room.  Patient understood and is  agreeable.  He will need evaluation there including ECG and further ischemia evaluation as indicated based on labs, ECG and history.  Continue aspirin and statin. 2. Hypertension-Continue present medications.  3. Hyperlipidemia-continue statin.  4. Tobacco abuse-needs to discontinue.  COVID-19 Education: The importance of social distancing was discussed today.  Time:   Today, I have spent 16 minutes with the patient with telehealth technology discussing the above problems.     Medication Adjustments/Labs and Tests Ordered: Current medicines are reviewed at length with the patient today.  Concerns regarding medicines are outlined above.   Tests Ordered: No orders of the defined types were placed in this encounter.   Medication Changes: No orders of the defined types were placed in this encounter.   Follow Up:  Either In Person or Virtual in 1 year(s)  Signed, [name], MD  10/08/2019 9:14 AM    [place]

## 2019-10-08 ENCOUNTER — Encounter: Payer: Self-pay | Admitting: Cardiology

## 2019-10-08 ENCOUNTER — Telehealth (INDEPENDENT_AMBULATORY_CARE_PROVIDER_SITE_OTHER): Payer: Medicare Other | Admitting: Cardiology

## 2019-10-08 VITALS — Ht 72.0 in | Wt 250.0 lb

## 2019-10-08 DIAGNOSIS — E782 Mixed hyperlipidemia: Secondary | ICD-10-CM

## 2019-10-08 DIAGNOSIS — I10 Essential (primary) hypertension: Secondary | ICD-10-CM

## 2019-10-08 DIAGNOSIS — Z720 Tobacco use: Secondary | ICD-10-CM | POA: Diagnosis not present

## 2019-10-08 DIAGNOSIS — I2510 Atherosclerotic heart disease of native coronary artery without angina pectoris: Secondary | ICD-10-CM | POA: Diagnosis not present

## 2019-10-08 NOTE — Patient Instructions (Signed)
Medication Instructions:  NO CHANGE *If you need a refill on your cardiac medications before your next appointment, please call your pharmacy*   Lab Work: If you have labs (blood work) drawn today and your tests are completely normal, you will receive your results only by: . MyChart Message (if you have MyChart) OR . A paper copy in the mail If you have any lab test that is abnormal or we need to change your treatment, we will call you to review the results.   Follow-Up: At [facility], you and your health needs are our priority.  As part of our continuing mission to provide you with exceptional heart care, we have created designated Provider Care Teams.  These Care Teams include your primary Cardiologist (physician) and Advanced Practice Providers (APPs -  Physician Assistants and Nurse Practitioners) who all work together to provide you with the care you need, when you need it.  We recommend signing up for the patient portal called "MyChart".  Sign up information is provided on this After Visit Summary.  MyChart is used to connect with patients for Virtual Visits (Telemedicine).  Patients are able to view lab/test results, encounter notes, upcoming appointments, etc.  Non-urgent messages can be sent to your provider as well.   To learn more about what you can do with MyChart, go to [email or web address].    Your next appointment:   3 month(s)  The format for your next appointment:   In Person  Provider:   Brian Crenshaw, MD    

## 2019-11-18 ENCOUNTER — Other Ambulatory Visit (INDEPENDENT_AMBULATORY_CARE_PROVIDER_SITE_OTHER): Payer: Self-pay | Admitting: Primary Care

## 2019-11-18 DIAGNOSIS — Z760 Encounter for issue of repeat prescription: Secondary | ICD-10-CM

## 2019-11-19 NOTE — Telephone Encounter (Signed)
Sent to PCP ?

## 2019-12-15 ENCOUNTER — Telehealth (INDEPENDENT_AMBULATORY_CARE_PROVIDER_SITE_OTHER): Payer: Self-pay

## 2019-12-15 NOTE — Telephone Encounter (Signed)
Patient called to request a refill for  tamsulosin (FLOMAX) 0.4 MG CAPS capsule   QUEtiapine (SEROQUEL) 200 MG tablet  atorvastatin (LIPITOR) 80 MG tablet    Patient uses [facility] on [address]   Please advice [phone]

## 2019-12-15 NOTE — Telephone Encounter (Signed)
Patient stuck in [place] due to being covid positive. Please send if appropriate.

## 2019-12-21 ENCOUNTER — Other Ambulatory Visit (INDEPENDENT_AMBULATORY_CARE_PROVIDER_SITE_OTHER): Payer: Self-pay | Admitting: Primary Care

## 2019-12-21 DIAGNOSIS — Z760 Encounter for issue of repeat prescription: Secondary | ICD-10-CM

## 2019-12-21 NOTE — Telephone Encounter (Signed)
Medications refused . Not taking on a regular bases.

## 2019-12-22 ENCOUNTER — Other Ambulatory Visit (INDEPENDENT_AMBULATORY_CARE_PROVIDER_SITE_OTHER): Payer: Self-pay | Admitting: Primary Care

## 2019-12-22 DIAGNOSIS — Z760 Encounter for issue of repeat prescription: Secondary | ICD-10-CM

## 2019-12-22 MED ORDER — TAMSULOSIN HCL 0.4 MG PO CAPS: 0.4000 mg | ORAL_CAPSULE | Freq: Every day | ORAL | 0 refills | Status: DC

## 2019-12-22 MED ORDER — ATORVASTATIN CALCIUM 80 MG PO TABS: 80.0000 mg | ORAL_TABLET | Freq: Every evening | ORAL | 0 refills | Status: DC

## 2019-12-22 MED ORDER — LISINOPRIL 20 MG PO TABS: 20.0000 mg | ORAL_TABLET | Freq: Every day | ORAL | 0 refills | Status: DC

## 2020-04-13 ENCOUNTER — Encounter: Payer: Self-pay | Admitting: Cardiology

## 2020-08-09 ENCOUNTER — Other Ambulatory Visit: Payer: Self-pay

## 2020-08-09 ENCOUNTER — Encounter (HOSPITAL_COMMUNITY): Payer: Self-pay

## 2020-08-09 ENCOUNTER — Emergency Department (HOSPITAL_COMMUNITY)
Admission: EM | Admit: 2020-08-09 | Discharge: 2020-08-09 | Disposition: A | Discharge status: Home / Self Care | Payer: Medicare Other | Attending: Emergency Medicine | Admitting: Emergency Medicine

## 2020-08-09 ENCOUNTER — Emergency Department (HOSPITAL_COMMUNITY): Payer: Medicare Other

## 2020-08-09 DIAGNOSIS — K0889 Other specified disorders of teeth and supporting structures: Secondary | ICD-10-CM

## 2020-08-09 DIAGNOSIS — I10 Essential (primary) hypertension: Secondary | ICD-10-CM | POA: Diagnosis not present

## 2020-08-09 DIAGNOSIS — Z8546 Personal history of malignant neoplasm of prostate: Secondary | ICD-10-CM | POA: Diagnosis not present

## 2020-08-09 DIAGNOSIS — F17210 Nicotine dependence, cigarettes, uncomplicated: Secondary | ICD-10-CM | POA: Insufficient documentation

## 2020-08-09 DIAGNOSIS — R634 Abnormal weight loss: Secondary | ICD-10-CM | POA: Insufficient documentation

## 2020-08-09 DIAGNOSIS — Z79899 Other long term (current) drug therapy: Secondary | ICD-10-CM | POA: Insufficient documentation

## 2020-08-09 DIAGNOSIS — I214 Non-ST elevation (NSTEMI) myocardial infarction: Secondary | ICD-10-CM | POA: Diagnosis not present

## 2020-08-09 DIAGNOSIS — Z951 Presence of aortocoronary bypass graft: Secondary | ICD-10-CM | POA: Insufficient documentation

## 2020-08-09 LAB — CBC WITH DIFFERENTIAL/PLATELET
Abs Immature Granulocytes: 0.02 10*3/uL (ref 0.00–0.07)
Basophils Absolute: 0 10*3/uL (ref 0.0–0.1)
Basophils Relative: 0 %
Eosinophils Absolute: 0.2 10*3/uL (ref 0.0–0.5)
Eosinophils Relative: 2 %
HCT: 45.4 % (ref 39.0–52.0)
Hemoglobin: 15.3 g/dL (ref 13.0–17.0)
Immature Granulocytes: 0 %
Lymphocytes Relative: 23 %
Lymphs Abs: 2.3 10*3/uL (ref 0.7–4.0)
MCH: 31 pg (ref 26.0–34.0)
MCHC: 33.7 g/dL (ref 30.0–36.0)
MCV: 92.1 fL (ref 80.0–100.0)
Monocytes Absolute: 0.6 10*3/uL (ref 0.1–1.0)
Monocytes Relative: 6 %
Neutro Abs: 6.9 10*3/uL (ref 1.7–7.7)
Neutrophils Relative %: 69 %
Platelets: 202 10*3/uL (ref 150–400)
RBC: 4.93 MIL/uL (ref 4.22–5.81)
RDW: 13.3 % (ref 11.5–15.5)
WBC: 10 10*3/uL (ref 4.0–10.5)
nRBC: 0 % (ref 0.0–0.2)

## 2020-08-09 LAB — COMPREHENSIVE METABOLIC PANEL
ALT: 18 U/L (ref 0–44)
AST: 14 U/L — ABNORMAL LOW (ref 15–41)
Albumin: 4.1 g/dL (ref 3.5–5.0)
Alkaline Phosphatase: 68 U/L (ref 38–126)
Anion gap: 7 (ref 5–15)
BUN: 13 mg/dL (ref 8–23)
CO2: 24 mmol/L (ref 22–32)
Calcium: 9.4 mg/dL (ref 8.9–10.3)
Chloride: 110 mmol/L (ref 98–111)
Creatinine, Ser: 0.97 mg/dL (ref 0.61–1.24)
GFR, Estimated: >60 mL/min (ref >60)
Glucose, Bld: 89 mg/dL (ref 70–99)
Potassium: 4 mmol/L (ref 3.5–5.1)
Sodium: 141 mmol/L (ref 135–145)
Total Bilirubin: 0.5 mg/dL (ref 0.3–1.2)
Total Protein: 7.8 g/dL (ref 6.5–8.1)

## 2020-08-09 LAB — RAPID HIV SCREEN (HIV 1/2 AB+AG)
HIV 1/2 Antibodies: NONREACTIVE
HIV-1 P24 Antigen - HIV24: NONREACTIVE

## 2020-08-09 MED ORDER — AMOXICILLIN 500 MG PO CAPS: 500.0000 mg | ORAL_CAPSULE | Freq: Three times a day (TID) | ORAL | 0 refills | Status: AC
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
## 2020-08-09 NOTE — ED Triage Notes (Signed)
Pt arrived via walk in, states in the last 4 months he has lost over 80lbs. Denies any pain or any other issues. Hx of prostate cancer, in remission, no current treatments.

## 2020-08-09 NOTE — Discharge Instructions (Addendum)
Your laboratory work-up and imaging of the chest was reviewed and unremarkable.  Please follow-up with your primary care provider as well as your oncologist regarding today's encounter and your recent weight loss.  I also strongly encourage you to see a dentist as soon as possible regarding your dental complaints.    Return to the ED or seek immediate medical attention if you experience any new or worsening symptoms.

## 2020-08-10 LAB — RPR: RPR Ser Ql: NONREACTIVE

## 2020-10-05 ENCOUNTER — Ambulatory Visit (INDEPENDENT_AMBULATORY_CARE_PROVIDER_SITE_OTHER): Payer: Medicare Other | Admitting: Primary Care

## 2020-10-05 ENCOUNTER — Encounter (INDEPENDENT_AMBULATORY_CARE_PROVIDER_SITE_OTHER): Payer: Self-pay | Admitting: Primary Care

## 2020-10-05 ENCOUNTER — Other Ambulatory Visit: Payer: Self-pay

## 2020-10-05 VITALS — BP 178/99 | HR 72 | Temp 97.3°F | Ht 72.0 in | Wt 190.2 lb

## 2020-10-05 DIAGNOSIS — Z760 Encounter for issue of repeat prescription: Secondary | ICD-10-CM

## 2020-10-05 DIAGNOSIS — I10 Essential (primary) hypertension: Secondary | ICD-10-CM

## 2020-10-05 DIAGNOSIS — Z1211 Encounter for screening for malignant neoplasm of colon: Secondary | ICD-10-CM

## 2020-10-05 DIAGNOSIS — E782 Mixed hyperlipidemia: Secondary | ICD-10-CM | POA: Diagnosis not present

## 2020-10-05 DIAGNOSIS — H543 Unqualified visual loss, both eyes: Secondary | ICD-10-CM

## 2020-10-05 MED ORDER — TAMSULOSIN HCL 0.4 MG PO CAPS: 0.4000 mg | ORAL_CAPSULE | Freq: Every day | ORAL | 0 refills | Status: DC

## 2020-10-05 MED ORDER — LISINOPRIL 20 MG PO TABS: 20.0000 mg | ORAL_TABLET | Freq: Every day | ORAL | 1 refills | Status: AC

## 2020-10-05 MED ORDER — QUETIAPINE FUMARATE 100 MG PO TABS: 100.0000 mg | ORAL_TABLET | Freq: Every day | ORAL | 1 refills | Status: AC

## 2020-10-05 MED ORDER — NITROGLYCERIN 0.4 MG SL SUBL: 0.4000 mg | SUBLINGUAL_TABLET | SUBLINGUAL | 3 refills | Status: AC | PRN

## 2020-10-05 MED ORDER — ATORVASTATIN CALCIUM 80 MG PO TABS: 80.0000 mg | ORAL_TABLET | Freq: Every evening | ORAL | 1 refills | Status: AC

## 2020-10-05 MED ORDER — METOPROLOL SUCCINATE ER 100 MG PO TB24: ORAL_TABLET | ORAL | 0 refills | Status: DC

## 2020-10-05 NOTE — Progress Notes (Signed)
Pt is fasting 

## 2020-10-05 NOTE — Progress Notes (Signed)
Established Patient Office Visit  Subjective:  Patient ID: Stephen Stone, male    DOB: 04-24-1956  Age: 65 y.o. MRN: [id number]  CC:  Chief Complaint  Patient presents with  . Medication Refill    Patient has been out of all medication for about 7 days     HPI Stephen Stone is a 65 year old male who presents for HTN,  management of HTN Denies shortness of breath, headaches, chest pain or lower extremity edema, sudden onset, vision changes, unilateral weakness, dizziness, paresthesias. He been in [place] and out of medication for a week. Having problems with insomnia states Seroquel is the only medication that helps and works. Cancer free from Prostate cancer followed by urology .   Past Medical History:  Diagnosis Date  . Anxiety   . Coronary artery disease cardioloigst-  dr Stanford Breed--- previously cardiologist from [facility]   hx anterior wall STEMI,  10-05-2011  cardiac cath w/ PCI and BM stent x1 to distal LAD  . Depression   . Essential hypertension   . 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 Hyperlipidemia   . 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 NSTEMI (non-ST elevated myocardial infarction) ([place]) 02/09/2018  . Prostate cancer [facility]) urologist-  dr wrenn/  onologist-  dr Tammi Klippel   dx Nov 2017 , Gleason 6, in [place]--  and repeat bx 08-19-2017 ,  Stage T1c, Gleason 3+4, PSA 25.20, vol 47cc-- scheduled for radioactive seed implants 12-25-2017  . STEMI (ST elevation myocardial infarction) ([place]) 10/05/2011    [place], [place]   acute anterior wall--- s/p  Cardiac cath w/ PCI and stent to distal LAD    Past Surgical History:  Procedure Laterality Date  . CARDIOVASCULAR STRESS TEST  04-23-2017   dr Stanford Breed   Low Risk Nuclear perfusion study w/ no evidence ischemia/  normal LV function and wall motion,  nuclear stress ef 58%  . CORONARY ANGIOPLASTY WITH STENT PLACEMENT  10-05-2011    [facility] in [place], [place] (records scanned in epic)   in setting anterior STEMI-- PCI and BM (Vision) stent x1 to distal LAD normal LVEF 60% with hypokinesis in the mid portion tf the anterolateral wall (30% mLAD,  30% CFx , 95% dLAD)  . CORONARY STENT INTERVENTION N/A 02/10/2018   Procedure: CORONARY STENT INTERVENTION;  Surgeon: Martinique, Peter M, MD;  Location: [place] [facility];  Service: Cardiovascular;  Laterality: N/A;  . CYSTOSCOPY N/A 08/19/2017   Procedure: CYSTOSCOPY;  Surgeon: [name], MD;  Location: [facility];  Service: Urology;  Laterality: N/A;  . CYSTOSCOPY  12/25/2017   Procedure: CYSTOSCOPY;  Surgeon: [name], MD;  Location[facility];  Service: Urology;;  no seeds noted in bladder  . EYE SURGERY Bilateral    "for the glaucoma; still went blind after 2-3 operations on each eye" (02/10/2018)  . LEFT HEART CATH AND CORONARY ANGIOGRAPHY N/A 02/10/2018   Procedure: LEFT HEART CATH AND CORONARY ANGIOGRAPHY;  Surgeon: Martinique, Peter M, MD;  Location: [place] [facility];  Service: Cardiovascular;  Laterality: N/A;  . PROSTATE BIOPSY N/A 08/19/2017   Procedure: BIOPSY TRANSRECTAL ULTRASONIC PROSTATE (TUBP);  Surgeon: [name], MD;  Location: [facility];  Service: Urology;  Laterality: N/A;  . RADIOACTIVE SEED IMPLANT N/A 12/25/2017   Procedure: RADIOACTIVE SEED IMPLANT/BRACHYTHERAPY IMPLANT;  Surgeon: [name], MD;  Location:  [place];  Service: Urology;  Laterality: N/A;  77 seeds implanted  . SPACE OAR INSTILLATION N/A 12/25/2017   Procedure: SPACE OAR INSTILLATION;  Surgeon: [name], MD;  Location: [facility];  Service: Urology;  Laterality: N/A;  . TRANSTHORACIC ECHOCARDIOGRAM  03-21-2016    Dr Ronal Fear [facility])   mild concentric LVH, ef 54-65%, grade 1 diastolic dysfunction/ mild PR/ trace TR    Family History  Problem Relation Age of Onset  . Cancer Mother         pancreatic  . CAD Father   . Cancer Brother        prostate  . Cancer Brother        prostate    Social History   Socioeconomic History  . Marital status: Divorced    Spouse name: Not on file  . Number of children: 7  . Years of education: Not on file  . Highest education level: Not on file  Occupational History  . Not on file  Tobacco Use  . Smoking status: Current Some Day Smoker    Packs/day: 0.25    Years: 6.00    Pack years: 1.50    Types: Cigarettes    Last attempt to quit: 12/27/2017    Years since quitting: 2.7  . Smokeless tobacco: Never Used  Vaping Use  . Vaping Use: Never used  Substance and Sexual Activity  . Alcohol use: Not Currently  . Drug use: Not Currently    Types: Marijuana    Comment: 02/10/2018 "in my 20's"  . Sexual activity: Not Currently    Comment: 2 years per patient  Other Topics Concern  . Not on file  Social History Narrative  . Not on file   Social Determinants of Health   Financial Resource Strain: Not on file  Food Insecurity: Not on file  Transportation Needs: Not on file  Physical Activity: Not on file  Stress: Not on file  Social Connections: Not on file  Intimate Partner Violence: Not on file    Outpatient Medications Prior to Visit  Medication Sig Dispense Refill  . ibuprofen (ADVIL) 800 MG tablet Take 800 mg by mouth every 8 (eight) hours as needed for fever, headache or mild pain.    Marland Kitchen atorvastatin (LIPITOR) 80 MG tablet Take 1 tablet (80 mg total) by mouth every evening. 30 tablet 0  . lisinopril (ZESTRIL) 20 MG tablet Take 1 tablet (20 mg total) by mouth daily. 30 tablet 0  . metoprolol succinate (TOPROL-XL) 100 MG 24 hr tablet TAKE 1 TABLET BY MOUTH ONCE DAILY WITH MEALS (Patient taking differently: Take 100 mg by mouth daily.) 90 tablet 0  . QUEtiapine (SEROQUEL) 200 MG tablet Take 1 tablet (200 mg total) by mouth at bedtime. 90 tablet 0  . tamsulosin (FLOMAX) 0.4 MG CAPS capsule Take 1 capsule (0.4 mg total) by  mouth daily. 30 capsule 0   No facility-administered medications prior to visit.    No Known Allergies  ROS Review of Systems Positive and Negative noted in HPI   Objective:    Physical Exam Vitals reviewed.  Constitutional:      Appearance: Normal appearance.  HENT:     Head: Normocephalic.     Right Ear: Tympanic membrane and external ear normal.     Left Ear: Tympanic membrane and external ear normal.     Nose: Nose normal.  Eyes:     Comments: NO EOM/PERRL  Cardiovascular:  Rate and Rhythm: Normal rate and regular rhythm.  Pulmonary:     Effort: Pulmonary effort is normal.     Breath sounds: Normal breath sounds.  Abdominal:     General: Bowel sounds are normal.     Palpations: Abdomen is soft.  Musculoskeletal:        General: Normal range of motion.     Cervical back: Normal range of motion.  Skin:    General: Skin is warm and dry.  Neurological:     Mental Status: He is alert and oriented to person, place, and time.  Psychiatric:        Mood and Affect: Mood normal.        Behavior: Behavior normal.        Thought Content: Thought content normal.        Judgment: Judgment normal.     BP (!) 178/99 (BP Location: Right Arm, Patient Position: Sitting, Cuff Size: Normal)   Pulse 72   Temp (!) 97.3 F (36.3 C) (Temporal)   Ht 6' (1.829 m)   Wt 190 lb 3.2 oz (86.3 kg)   SpO2 96%   BMI 25.80 kg/m  Wt Readings from Last 3 Encounters:  10/05/20 190 lb 3.2 oz (86.3 kg)  10/08/19 250 lb (113.4 kg)  02/10/19 234 lb 3.2 oz (106.2 kg)     Health Maintenance Due  Topic Date Due  . COVID-19 Vaccine (1) Never done  . Fecal DNA (Cologuard)  Never done  . PNA vac Low Risk Adult (1 of 2 - PCV13) Never done    There are no preventive care reminders to display for this patient.  No results found for: TSH Lab Results  Component Value Date   WBC 10.0 08/09/2020   HGB 15.3 08/09/2020   HCT 45.4 08/09/2020   MCV 92.1 08/09/2020   PLT 202 08/09/2020    Lab Results  Component Value Date   NA 141 08/09/2020   K 4.0 08/09/2020   CO2 24 08/09/2020   GLUCOSE 89 08/09/2020   BUN 13 08/09/2020   CREATININE 0.97 08/09/2020   BILITOT 0.5 08/09/2020   ALKPHOS 68 08/09/2020   AST 14 (L) 08/09/2020   ALT 18 08/09/2020   PROT 7.8 08/09/2020   ALBUMIN 4.1 08/09/2020   CALCIUM 9.4 08/09/2020   ANIONGAP 7 08/09/2020   Lab Results  Component Value Date   CHOL 208 (H) 10/05/2020   Lab Results  Component Value Date   HDL 47 10/05/2020   Lab Results  Component Value Date   LDLCALC 134 (H) 10/05/2020   Lab Results  Component Value Date   TRIG 150 (H) 10/05/2020   Lab Results  Component Value Date   CHOLHDL 4.4 10/05/2020   No results found for: HGBA1C    Assessment & Plan:  Stephen Stone was seen today for medication refill.  Diagnoses and all orders for this visit:  Colon cancer screening -     Ambulatory referral to Gastroenterology  Essential hypertension Blood pressure is elevated out of medications goal of therapy 140/90 low-sodium diet exercise as tolerated -     lisinopril (ZESTRIL) 20 MG tablet; Take 1 tablet (20 mg total) by mouth daily. -     metoprolol succinate (TOPROL-XL) 100 MG 24 hr tablet; TAKE 1 TABLET BY MOUTH ONCE DAILY WITH MEALS  Mixed hyperlipidemia -     Lipid Panel  Medication refill -     atorvastatin (LIPITOR) 80 MG tablet; Take 1 tablet (80 mg total) by  mouth every evening. -     QUEtiapine (SEROQUEL) 100 MG tablet; Take 1 tablet (100 mg total) by mouth at bedtime. -     lisinopril (ZESTRIL) 20 MG tablet; Take 1 tablet (20 mg total) by mouth daily. -     tamsulosin (FLOMAX) 0.4 MG CAPS capsule; Take 1 capsule (0.4 mg total) by mouth daily. -     metoprolol succinate (TOPROL-XL) 100 MG 24 hr tablet; TAKE 1 TABLET BY MOUTH ONCE DAILY WITH MEALS -     nitroGLYCERIN (NITROSTAT) 0.4 MG SL tablet; Place 1 tablet (0.4 mg total) under the tongue every 5 (five) minutes as needed for chest pain.  Blindness  of both eyes Due to glaucoma   Meds ordered this encounter  Medications  . atorvastatin (LIPITOR) 80 MG tablet    Sig: Take 1 tablet (80 mg total) by mouth every evening.    Dispense:  90 tablet    Refill:  1  . QUEtiapine (SEROQUEL) 100 MG tablet    Sig: Take 1 tablet (100 mg total) by mouth at bedtime.    Dispense:  90 tablet    Refill:  1  . lisinopril (ZESTRIL) 20 MG tablet    Sig: Take 1 tablet (20 mg total) by mouth daily.    Dispense:  90 tablet    Refill:  1  . tamsulosin (FLOMAX) 0.4 MG CAPS capsule    Sig: Take 1 capsule (0.4 mg total) by mouth daily.    Dispense:  30 capsule    Refill:  0  . metoprolol succinate (TOPROL-XL) 100 MG 24 hr tablet    Sig: TAKE 1 TABLET BY MOUTH ONCE DAILY WITH MEALS    Dispense:  90 tablet    Refill:  0  . nitroGLYCERIN (NITROSTAT) 0.4 MG SL tablet    Sig: Place 1 tablet (0.4 mg total) under the tongue every 5 (five) minutes as needed for chest pain.    Dispense:  50 tablet    Refill:  3  glaucoma   Follow-up: Return in about 3 weeks (around 10/26/2020) for BP f/u and notify cardiology .    Kerin Perna, NP

## 2020-10-05 NOTE — Patient Instructions (Signed)
Managing Your Hypertension Hypertension, also called high blood pressure, is when the force of the blood pressing against the walls of the arteries is too strong. Arteries are blood vessels that carry blood from your heart throughout your body. Hypertension forces the heart to work harder to pump blood and may cause the arteries to become narrow or stiff. Understanding blood pressure readings Your personal target blood pressure may vary depending on your medical conditions, your age, and other factors. A blood pressure reading includes a higher number over a lower number. Ideally, your blood pressure should be below 120/80. You should know that:  The first, or top, number is called the systolic pressure. It is a measure of the pressure in your arteries as your heart beats.  The second, or bottom number, is called the diastolic pressure. It is a measure of the pressure in your arteries as the heart relaxes. Blood pressure is classified into four stages. Based on your blood pressure reading, your health care provider may use the following stages to determine what type of treatment you need, if any. Systolic pressure and diastolic pressure are measured in a unit called mmHg. Normal  Systolic pressure: below 120.  Diastolic pressure: below 80. Elevated  Systolic pressure: 120-129.  Diastolic pressure: below 80. Hypertension stage 1  Systolic pressure: 130-139.  Diastolic pressure: 80-89. Hypertension stage 2  Systolic pressure: 140 or above.  Diastolic pressure: 90 or above. How can this condition affect me? Managing your hypertension is an important responsibility. Over time, hypertension can damage the arteries and decrease blood flow to important parts of the body, including the brain, heart, and kidneys. Having untreated or uncontrolled hypertension can lead to:  A heart attack.  A stroke.  A weakened blood vessel (aneurysm).  Heart failure.  Kidney damage.  Eye  damage.  Metabolic syndrome.  Memory and concentration problems.  Vascular dementia. What actions can I take to manage this condition? Hypertension can be managed by making lifestyle changes and possibly by taking medicines. Your health care provider will help you make a plan to bring your blood pressure within a normal range. Nutrition  Eat a diet that is high in fiber and potassium, and low in salt (sodium), added sugar, and fat. An example eating plan is called the Dietary Approaches to Stop Hypertension (DASH) diet. To eat this way: ? Eat plenty of fresh fruits and vegetables. Try to fill one-half of your plate at each meal with fruits and vegetables. ? Eat whole grains, such as whole-wheat pasta, brown rice, or whole-grain bread. Fill about one-fourth of your plate with whole grains. ? Eat low-fat dairy products. ? Avoid fatty cuts of meat, processed or cured meats, and poultry with skin. Fill about one-fourth of your plate with lean proteins such as fish, chicken without skin, beans, eggs, and tofu. ? Avoid pre-made and processed foods. These tend to be higher in sodium, added sugar, and fat.  Reduce your daily sodium intake. Most people with hypertension should eat less than 1,500 mg of sodium a day.   Lifestyle  Work with your health care provider to maintain a healthy body weight or to lose weight. Ask what an ideal weight is for you.  Get at least 30 minutes of exercise that causes your heart to beat faster (aerobic exercise) most days of the week. Activities may include walking, swimming, or biking.  Include exercise to strengthen your muscles (resistance exercise), such as weight lifting, as part of your weekly exercise routine. Try   to do these types of exercises for 30 minutes at least 3 days a week.  Do not use any products that contain nicotine or tobacco, such as cigarettes, e-cigarettes, and chewing tobacco. If you need help quitting, ask your health care  provider.  Control any long-term (chronic) conditions you have, such as high cholesterol or diabetes.  Identify your sources of stress and find ways to manage stress. This may include meditation, deep breathing, or making time for fun activities.   Alcohol use  Do not drink alcohol if: ? Your health care provider tells you not to drink. ? You are pregnant, may be pregnant, or are planning to become pregnant.  If you drink alcohol: ? Limit how much you use to:  0-1 drink a day for women.  0-2 drinks a day for men. ? Be aware of how much alcohol is in your drink. In the [place] one drink equals one 12 oz bottle of beer (355 mL), one 5 oz glass of wine (148 mL), or one 1 oz glass of hard liquor (44 mL). Medicines Your health care provider may prescribe medicine if lifestyle changes are not enough to get your blood pressure under control and if:  Your systolic blood pressure is 130 or higher.  Your diastolic blood pressure is 80 or higher. Take medicines only as told by your health care provider. Follow the directions carefully. Blood pressure medicines must be taken as told by your health care provider. The medicine does not work as well when you skip doses. Skipping doses also puts you at risk for problems. Monitoring Before you monitor your blood pressure:  Do not smoke, drink caffeinated beverages, or exercise within 30 minutes before taking a measurement.  Use the bathroom and empty your bladder (urinate).  Sit quietly for at least 5 minutes before taking measurements. Monitor your blood pressure at home as told by your health care provider. To do this:  Sit with your back straight and supported.  Place your feet flat on the floor. Do not cross your legs.  Support your arm on a flat surface, such as a table. Make sure your upper arm is at heart level.  Each time you measure, take two or three readings one minute apart and record the results. You may also need to have your  blood pressure checked regularly by your health care provider.   General information  Talk with your health care provider about your diet, exercise habits, and other lifestyle factors that may be contributing to hypertension.  Review all the medicines you take with your health care provider because there may be side effects or interactions.  Keep all visits as told by your health care provider. Your health care provider can help you create and adjust your plan for managing your high blood pressure. Where to find more information  National Heart, Lung, and Blood Institute: www.nhlbi.nih.gov  American Heart Association: www.heart.org Contact a health care provider if:  You think you are having a reaction to medicines you have taken.  You have repeated (recurrent) headaches.  You feel dizzy.  You have swelling in your ankles.  You have trouble with your vision. Get help right away if:  You develop a severe headache or confusion.  You have unusual weakness or numbness, or you feel faint.  You have severe pain in your chest or abdomen.  You vomit repeatedly.  You have trouble breathing. These symptoms may represent a serious problem that is an emergency. Do not wait   to see if the symptoms will go away. Get medical help right away. Call your local emergency services (911 in the [place] Do not drive yourself to the hospital. Summary  Hypertension is when the force of blood pumping through your arteries is too strong. If this condition is not controlled, it may put you at risk for serious complications.  Your personal target blood pressure may vary depending on your medical conditions, your age, and other factors. For most people, a normal blood pressure is less than 120/80.  Hypertension is managed by lifestyle changes, medicines, or both.  Lifestyle changes to help manage hypertension include losing weight, eating a healthy, low-sodium diet, exercising more, stopping smoking, and  limiting alcohol. This information is not intended to replace advice given to you by your health care provider. Make sure you discuss any questions you have with your health care provider. Document Revised: 08/20/2019 Document Reviewed: 06/15/2019 Elsevier Patient Education  2021 [employer]  

## 2020-10-06 LAB — LIPID PANEL
Chol/HDL Ratio: 4.4 ratio (ref 0.0–5.0)
Cholesterol, Total: 208 mg/dL — ABNORMAL HIGH (ref 100–199)
HDL: 47 mg/dL (ref >39)
LDL Chol Calc (NIH): 134 mg/dL — ABNORMAL HIGH (ref 0–99)
Triglycerides: 150 mg/dL — ABNORMAL HIGH (ref 0–149)
VLDL Cholesterol Cal: 27 mg/dL (ref 5–40)

## 2020-10-26 ENCOUNTER — Ambulatory Visit (INDEPENDENT_AMBULATORY_CARE_PROVIDER_SITE_OTHER): Payer: Medicare Other | Admitting: Primary Care

## 2020-10-30 ENCOUNTER — Other Ambulatory Visit: Payer: Self-pay

## 2020-10-30 ENCOUNTER — Encounter (INDEPENDENT_AMBULATORY_CARE_PROVIDER_SITE_OTHER): Payer: Self-pay | Admitting: Primary Care

## 2020-10-30 ENCOUNTER — Ambulatory Visit (INDEPENDENT_AMBULATORY_CARE_PROVIDER_SITE_OTHER): Payer: Medicare Other | Admitting: Primary Care

## 2020-10-30 VITALS — BP 156/93 | HR 81 | Temp 97.5°F | Ht 72.0 in | Wt 197.0 lb

## 2020-10-30 DIAGNOSIS — Z760 Encounter for issue of repeat prescription: Secondary | ICD-10-CM

## 2020-10-30 DIAGNOSIS — Z9189 Other specified personal risk factors, not elsewhere classified: Secondary | ICD-10-CM

## 2020-10-30 DIAGNOSIS — H543 Unqualified visual loss, both eyes: Secondary | ICD-10-CM

## 2020-10-30 DIAGNOSIS — Z720 Tobacco use: Secondary | ICD-10-CM | POA: Diagnosis not present

## 2020-10-30 DIAGNOSIS — I25709 Atherosclerosis of coronary artery bypass graft(s), unspecified, with unspecified angina pectoris: Secondary | ICD-10-CM

## 2020-10-30 DIAGNOSIS — I10 Essential (primary) hypertension: Secondary | ICD-10-CM

## 2020-10-30 DIAGNOSIS — C61 Malignant neoplasm of prostate: Secondary | ICD-10-CM

## 2020-10-30 MED ORDER — TAMSULOSIN HCL 0.4 MG PO CAPS: 0.4000 mg | ORAL_CAPSULE | Freq: Every day | ORAL | 0 refills | Status: DC

## 2020-10-30 MED ORDER — AMLODIPINE BESYLATE 10 MG PO TABS: 10.0000 mg | ORAL_TABLET | Freq: Every day | ORAL | 3 refills | Status: AC

## 2020-10-30 MED ORDER — AMOXICILLIN-POT CLAVULANATE 875-125 MG PO TABS: 1.0000 | ORAL_TABLET | Freq: Two times a day (BID) | ORAL | 0 refills | Status: AC

## 2020-10-30 NOTE — Progress Notes (Signed)
.me   Established Patient Office Visit  Subjective:  Patient ID: Stephen Stone, male    DOB: 02-02-1956  Age: 65 y.o. MRN: [id number]  CC:  Chief Complaint  Patient presents with  . Blood Pressure Check    HPI Stephen Stone is a 65 year old male who  presents for Bp denies shortness of breath, headaches, chest pain or lower extremity edema, sudden onset, vision changes(blind) , unilateral weakness, dizziness, paresthesias  Past Medical History:  Diagnosis Date  . Anxiety   . Coronary artery disease cardioloigst-  dr Stanford Breed--- previously cardiologist from [facility]   hx anterior wall STEMI,  10-05-2011  cardiac cath w/ PCI and BM stent x1 to distal LAD  . Depression   . Essential hypertension   . 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 Hyperlipidemia   . 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 NSTEMI (non-ST elevated myocardial infarction) ([place]) 02/09/2018  . Prostate cancer [facility]) urologist-  dr wrenn/  onologist-  dr Tammi Klippel   dx Nov 2017 , Gleason 6, in [place]--  and repeat bx 08-19-2017 ,  Stage T1c, Gleason 3+4, PSA 25.20, vol 47cc-- scheduled for radioactive seed implants 12-25-2017  . STEMI (ST elevation myocardial infarction) ([place]) 10/05/2011    [place], [place]   acute anterior wall--- s/p  Cardiac cath w/ PCI and stent to distal LAD    Past Surgical History:  Procedure Laterality Date  . CARDIOVASCULAR STRESS TEST  04-23-2017   dr Stanford Breed   Low Risk Nuclear perfusion study w/ no evidence ischemia/  normal LV function and wall motion,  nuclear stress ef 58%  . CORONARY ANGIOPLASTY WITH STENT PLACEMENT  10-05-2011   [facility] in [place], [place] (records scanned in epic)   in setting anterior STEMI-- PCI and BM (Vision) stent x1 to distal LAD normal LVEF 60% with hypokinesis in the mid portion tf the anterolateral wall (30% mLAD,  30% CFx , 95% dLAD)  . CORONARY STENT  INTERVENTION N/A 02/10/2018   Procedure: CORONARY STENT INTERVENTION;  Surgeon: Martinique, Peter M, MD;  Location: [place] [facility];  Service: Cardiovascular;  Laterality: N/A;  . CYSTOSCOPY N/A 08/19/2017   Procedure: CYSTOSCOPY;  Surgeon: [name], MD;  Location: [facility];  Service: Urology;  Laterality: N/A;  . CYSTOSCOPY  12/25/2017   Procedure: CYSTOSCOPY;  Surgeon: [name], MD;  Location: [facility];  Service: Urology;;  no seeds noted in bladder  . EYE SURGERY Bilateral    "for the glaucoma; still went blind after 2-3 operations on each eye" (02/10/2018)  . LEFT HEART CATH AND CORONARY ANGIOGRAPHY N/A 02/10/2018   Procedure: LEFT HEART CATH AND CORONARY ANGIOGRAPHY;  Surgeon: Martinique, Peter M, MD;  Location: [place] [facility];  Service: Cardiovascular;  Laterality: N/A;  . PROSTATE BIOPSY N/A 08/19/2017   Procedure: BIOPSY TRANSRECTAL ULTRASONIC PROSTATE (TUBP);  Surgeon: [name], MD;  Location: [facility];  Service: Urology;  Laterality: N/A;  . RADIOACTIVE SEED IMPLANT N/A 12/25/2017   Procedure: RADIOACTIVE SEED IMPLANT/BRACHYTHERAPY IMPLANT;  Surgeon: [name], MD;  Location: [facility];  Service: Urology;  Laterality: N/A;  77 seeds implanted  . SPACE OAR INSTILLATION N/A 12/25/2017   Procedure: SPACE OAR INSTILLATION;  Surgeon: [name], MD;  Location: [facility];  Service: Urology;  Laterality: N/A;  . TRANSTHORACIC ECHOCARDIOGRAM  03-21-2016    Dr Ronal [facility])   mild concentric LVH, ef 76-16%, grade 1 diastolic dysfunction/ mild PR/ trace TR    Family History  Problem Relation Age of Onset  . Cancer Mother        pancreatic  . CAD Father   . Cancer Brother        prostate  . Cancer Brother        prostate    Social History   Socioeconomic History  . Marital status: Divorced    Spouse name: Not on file  . Number of children: 7  . Years of education: Not on file  .  Highest education level: Not on file  Occupational History  . Not on file  Tobacco Use  . Smoking status: Current Some Day Smoker    Packs/day: 0.25    Years: 6.00    Pack years: 1.50    Types: Cigarettes    Last attempt to quit: 12/27/2017    Years since quitting: 2.8  . Smokeless tobacco: Never Used  Vaping Use  . Vaping Use: Never used  Substance and Sexual Activity  . Alcohol use: Not Currently  . Drug use: Not Currently    Types: Marijuana    Comment: 02/10/2018 "in my 20's"  . Sexual activity: Not Currently    Comment: 2 years per patient  Other Topics Concern  . Not on file  Social History Narrative  . Not on file   Social Determinants of Health   Financial Resource Strain: Not on file  Food Insecurity: Not on file  Transportation Needs: Not on file  Physical Activity: Not on file  Stress: Not on file  Social Connections: Not on file  Intimate Partner Violence: Not on file    Outpatient Medications Prior to Visit  Medication Sig Dispense Refill  . atorvastatin (LIPITOR) 80 MG tablet Take 1 tablet (80 mg total) by mouth every evening. 90 tablet 1  . ibuprofen (ADVIL) 800 MG tablet Take 800 mg by mouth every 8 (eight) hours as needed for fever, headache or mild pain.    Marland Kitchen lisinopril (ZESTRIL) 20 MG tablet Take 1 tablet (20 mg total) by mouth daily. 90 tablet 1  . metoprolol succinate (TOPROL-XL) 100 MG 24 hr tablet TAKE 1 TABLET BY MOUTH ONCE DAILY WITH MEALS 90 tablet 0  . nitroGLYCERIN (NITROSTAT) 0.4 MG SL tablet Place 1 tablet (0.4 mg total) under the tongue every 5 (five) minutes as needed for chest pain. 50 tablet 3  . QUEtiapine (SEROQUEL) 100 MG tablet Take 1 tablet (100 mg total) by mouth at bedtime. 90 tablet 1  . tamsulosin (FLOMAX) 0.4 MG CAPS capsule Take 1 capsule (0.4 mg total) by mouth daily. 30 capsule 0   No facility-administered medications prior to visit.    No Known Allergies  ROS Review of Systems  HENT: Positive for dental problem.         Sensitive to temperature change   Eyes:       Right eye blind   All other systems reviewed and are negative.     Objective:    Physical Exam Vitals reviewed.  Constitutional:      Appearance: Normal appearance.  HENT:     Head: Normocephalic.     Right Ear: External ear normal.     Left Ear: External ear normal.     Nose: Nose normal.  Cardiovascular:     Rate and Rhythm: Normal rate and regular rhythm.  Pulmonary:     Effort: Pulmonary effort is normal.     Breath sounds: Normal breath sounds.  Abdominal:     General: Bowel sounds are normal. There is distension.     Palpations: Abdomen is soft.  Musculoskeletal:        General: Normal range of motion.  Skin:    General: Skin is warm and dry.  Neurological:     Mental Status: He is alert and oriented to person, place, and time.  Psychiatric:        Mood and Affect: Mood normal.        Behavior: Behavior normal.        Thought Content: Thought content normal.        Judgment: Judgment normal.     BP (!) 156/93 (BP Location: Right Arm, Patient Position: Sitting, Cuff Size: Normal)   Pulse 81   Temp (!) 97.5 F (36.4 C) (Temporal)   Ht 6' (1.829 m)   Wt 197 lb (89.4 kg)   SpO2 95%   BMI 26.72 kg/m  Wt Readings from Last 3 Encounters:  10/30/20 197 lb (89.4 kg)  10/05/20 190 lb 3.2 oz (86.3 kg)  10/08/19 250 lb (113.4 kg)     Health Maintenance Due  Topic Date Due  . COVID-19 Vaccine (1) Never done  . Fecal DNA (Cologuard)  Never done  . PNA vac Low Risk Adult (1 of 2 - PCV13) Never done    There are no preventive care reminders to display for this patient.  No results found for: TSH Lab Results  Component Value Date   WBC 10.0 08/09/2020   HGB 15.3 08/09/2020   HCT 45.4 08/09/2020   MCV 92.1 08/09/2020   PLT 202 08/09/2020   Lab Results  Component Value Date   NA 141 08/09/2020   K 4.0 08/09/2020   CO2 24 08/09/2020   GLUCOSE 89 08/09/2020   BUN 13 08/09/2020   CREATININE 0.97 08/09/2020    BILITOT 0.5 08/09/2020   ALKPHOS 68 08/09/2020   AST 14 (L) 08/09/2020   ALT 18 08/09/2020   PROT 7.8 08/09/2020   ALBUMIN 4.1 08/09/2020   CALCIUM 9.4 08/09/2020   ANIONGAP 7 08/09/2020   Lab Results  Component Value Date   CHOL 208 (H) 10/05/2020   Lab Results  Component Value Date   HDL 47 10/05/2020   Lab Results  Component Value Date   LDLCALC 134 (H) 10/05/2020   Lab Results  Component Value Date   TRIG 150 (H) 10/05/2020   Lab Results  Component Value Date   CHOLHDL 4.4 10/05/2020   No results found for: HGBA1C    Assessment & Plan:   Summer was seen today for blood pressure check.  Diagnoses and all orders for this visit:  Essential hypertension Counseled on blood pressure goal of less than 130/80, low-sodium, DASH diet, medication compliance, 150 minutes of moderate intensity exercise per week. Discussed medication compliance, adverse effects. Discussed reducing caffeine intake  Continue lisinopril 20mg  and metoprolol 100mg   XR both taking daily. States adherence. added-     amLODipine (NORVASC) 10 MG tablet; Take 1 tablet (10 mg total) by mouth daily.  Blindness of both eyes Legally Blind Primary from Glaucoma- uses a walking stick for the blind and his daughter assist with his walking.   Poor dental hygiene sensitive to temperature changes This has been fully explained to the patient, I do not tx dental problems will tx infection and he will schedule  a dental appt.  indicates understanding. Front desk will provide a list of dentist who accepts his insurance . He is responsible for making the appointment. -     amoxicillin-clavulanate (AUGMENTIN) 875-125 MG tablet; Take 1 tablet by mouth 2 (two) times daily.  Medication refill -     tamsulosin (FLOMAX) 0.4 MG CAPS capsule; Take 1 capsule (0.4 mg total) by mouth daily.  Prostate cancer ([place]) Dx 2017 and tx 2019 - hx of  Continue - tamsulosin (FLOMAX) 0.4 MG CAPS capsule; Take 1 capsule (0.4 mg  total) by mouth daily.   Coronary artery disease involving coronary bypass graft with angina pectoris, unspecified whether native or transplanted heart (HCC) LEFT HEART CATH AND CORONARY ANGIOGRAPHY and CORONARY STENT INTERVENTION by Dr. Martinique   Crenshaw, Denice Bors, MD cardiologist needs to make f/u appt    Follow-up: Return for keep schedule appt.    Kerin Perna, NP

## 2020-11-05 ENCOUNTER — Encounter (INDEPENDENT_AMBULATORY_CARE_PROVIDER_SITE_OTHER): Payer: Self-pay | Admitting: Primary Care

## 2020-11-21 ENCOUNTER — Other Ambulatory Visit (INDEPENDENT_AMBULATORY_CARE_PROVIDER_SITE_OTHER): Payer: Self-pay | Admitting: Primary Care

## 2020-11-21 DIAGNOSIS — Z760 Encounter for issue of repeat prescription: Secondary | ICD-10-CM

## 2021-02-15 ENCOUNTER — Encounter: Payer: Medicare Other | Admitting: Gastroenterology

## 2021-03-27 ENCOUNTER — Other Ambulatory Visit (INDEPENDENT_AMBULATORY_CARE_PROVIDER_SITE_OTHER): Payer: Self-pay | Admitting: Primary Care

## 2021-03-27 DIAGNOSIS — I10 Essential (primary) hypertension: Secondary | ICD-10-CM

## 2021-03-27 DIAGNOSIS — Z760 Encounter for issue of repeat prescription: Secondary | ICD-10-CM

## 2021-03-27 NOTE — Telephone Encounter (Signed)
Needs appt

## 2021-04-22 ENCOUNTER — Other Ambulatory Visit (INDEPENDENT_AMBULATORY_CARE_PROVIDER_SITE_OTHER): Payer: Self-pay | Admitting: Primary Care

## 2021-04-22 DIAGNOSIS — I10 Essential (primary) hypertension: Secondary | ICD-10-CM

## 2021-04-22 DIAGNOSIS — Z760 Encounter for issue of repeat prescription: Secondary | ICD-10-CM
